# Patient Record
Sex: Female | Born: 1970
Health system: Southern US, Community
[De-identification: ages and names within clinical notes are randomized; demographics above are authoritative.]

## PROBLEM LIST (undated history)

## (undated) DIAGNOSIS — K319 Disease of stomach and duodenum, unspecified: Secondary | ICD-10-CM

## (undated) DIAGNOSIS — K8010 Calculus of gallbladder with chronic cholecystitis without obstruction: Secondary | ICD-10-CM

## (undated) DIAGNOSIS — F32A Depression, unspecified: Secondary | ICD-10-CM

## (undated) DIAGNOSIS — O24419 Gestational diabetes mellitus in pregnancy, unspecified control: Secondary | ICD-10-CM

## (undated) DIAGNOSIS — E669 Obesity, unspecified: Secondary | ICD-10-CM

## (undated) DIAGNOSIS — O1490 Unspecified pre-eclampsia, unspecified trimester: Secondary | ICD-10-CM

## (undated) DIAGNOSIS — R1013 Epigastric pain: Secondary | ICD-10-CM

## (undated) DIAGNOSIS — F329 Major depressive disorder, single episode, unspecified: Secondary | ICD-10-CM

## (undated) DIAGNOSIS — E785 Hyperlipidemia, unspecified: Secondary | ICD-10-CM

## (undated) HISTORY — DX: Gestational diabetes mellitus in pregnancy, unspecified control: O24.419

## (undated) HISTORY — DX: Major depressive disorder, single episode, unspecified: F32.9

## (undated) HISTORY — PX: TUBAL LIGATION: SHX77

## (undated) HISTORY — DX: Unspecified pre-eclampsia, unspecified trimester: O14.90

## (undated) HISTORY — DX: Hyperlipidemia, unspecified: E78.5

## (undated) HISTORY — DX: Obesity, unspecified: E66.9

## (undated) HISTORY — PX: LAPAROSCOPIC CHOLECYSTECTOMY: SUR755

## (undated) HISTORY — PX: WISDOM TOOTH EXTRACTION: SHX21

## (undated) HISTORY — DX: Epigastric pain: R10.13

## (undated) HISTORY — DX: Disease of stomach and duodenum, unspecified: K31.9

## (undated) HISTORY — PX: CATARACT EXTRACTION: SUR2

## (undated) HISTORY — DX: Depression, unspecified: F32.A

## (undated) HISTORY — DX: Calculus of gallbladder with chronic cholecystitis without obstruction: K80.10

---

## 2000-03-14 ENCOUNTER — Other Ambulatory Visit: Admission: RE | Admit: 2000-03-14 | Discharge: 2000-03-14 | Payer: Self-pay | Admitting: Obstetrics and Gynecology

## 2001-03-15 ENCOUNTER — Other Ambulatory Visit: Admission: RE | Admit: 2001-03-15 | Discharge: 2001-03-15 | Payer: Self-pay | Admitting: *Deleted

## 2002-03-27 ENCOUNTER — Other Ambulatory Visit: Admission: RE | Admit: 2002-03-27 | Discharge: 2002-03-27 | Payer: Self-pay | Admitting: Gynecology

## 2002-04-19 DIAGNOSIS — O1490 Unspecified pre-eclampsia, unspecified trimester: Secondary | ICD-10-CM

## 2002-04-19 HISTORY — DX: Unspecified pre-eclampsia, unspecified trimester: O14.90

## 2002-08-22 ENCOUNTER — Observation Stay (HOSPITAL_COMMUNITY): Admission: AD | Admit: 2002-08-22 | Discharge: 2002-08-22 | Payer: Self-pay | Admitting: Gynecology

## 2002-08-22 ENCOUNTER — Encounter: Payer: Self-pay | Admitting: Gynecology

## 2002-08-31 ENCOUNTER — Inpatient Hospital Stay (HOSPITAL_COMMUNITY): Admission: AD | Admit: 2002-08-31 | Discharge: 2002-09-03 | Payer: Self-pay | Admitting: Gynecology

## 2002-09-01 ENCOUNTER — Encounter (INDEPENDENT_AMBULATORY_CARE_PROVIDER_SITE_OTHER): Payer: Self-pay | Admitting: Specialist

## 2002-10-15 ENCOUNTER — Other Ambulatory Visit: Admission: RE | Admit: 2002-10-15 | Discharge: 2002-10-15 | Payer: Self-pay | Admitting: Gynecology

## 2003-10-29 ENCOUNTER — Ambulatory Visit (HOSPITAL_COMMUNITY): Admission: RE | Admit: 2003-10-29 | Discharge: 2003-10-29 | Payer: Self-pay | Admitting: Internal Medicine

## 2004-12-07 ENCOUNTER — Other Ambulatory Visit: Admission: RE | Admit: 2004-12-07 | Discharge: 2004-12-07 | Payer: Self-pay | Admitting: Gynecology

## 2005-11-25 ENCOUNTER — Encounter: Admission: RE | Admit: 2005-11-25 | Discharge: 2005-11-25 | Payer: Self-pay | Admitting: Gynecology

## 2005-12-30 ENCOUNTER — Other Ambulatory Visit: Admission: RE | Admit: 2005-12-30 | Discharge: 2005-12-30 | Payer: Self-pay | Admitting: Gynecology

## 2006-03-07 DIAGNOSIS — Z789 Other specified health status: Secondary | ICD-10-CM | POA: Insufficient documentation

## 2008-04-19 DIAGNOSIS — O24419 Gestational diabetes mellitus in pregnancy, unspecified control: Secondary | ICD-10-CM

## 2008-04-19 HISTORY — DX: Gestational diabetes mellitus in pregnancy, unspecified control: O24.419

## 2008-09-03 ENCOUNTER — Encounter: Admission: RE | Admit: 2008-09-03 | Discharge: 2008-09-03 | Payer: Self-pay | Admitting: Obstetrics & Gynecology

## 2008-10-22 ENCOUNTER — Inpatient Hospital Stay (HOSPITAL_COMMUNITY): Admission: AD | Admit: 2008-10-22 | Discharge: 2008-10-22 | Payer: Self-pay | Admitting: Obstetrics and Gynecology

## 2008-11-13 ENCOUNTER — Inpatient Hospital Stay (HOSPITAL_COMMUNITY): Admission: AD | Admit: 2008-11-13 | Discharge: 2008-11-15 | Payer: Self-pay | Admitting: Obstetrics and Gynecology

## 2009-01-14 ENCOUNTER — Ambulatory Visit (HOSPITAL_COMMUNITY): Admission: RE | Admit: 2009-01-14 | Discharge: 2009-01-14 | Payer: Self-pay | Admitting: Obstetrics & Gynecology

## 2010-01-08 ENCOUNTER — Encounter: Admission: RE | Admit: 2010-01-08 | Discharge: 2010-01-08 | Payer: Self-pay | Admitting: Internal Medicine

## 2010-07-24 LAB — CBC
HCT: 34.8 % — ABNORMAL LOW (ref 36.0–46.0)
MCHC: 33.5 g/dL (ref 30.0–36.0)
Platelets: 239 10*3/uL (ref 150–400)
RDW: 14.5 % (ref 11.5–15.5)

## 2010-07-26 LAB — CBC
HCT: 29.2 % — ABNORMAL LOW (ref 36.0–46.0)
MCHC: 34 g/dL (ref 30.0–36.0)
Platelets: 142 10*3/uL — ABNORMAL LOW (ref 150–400)
RBC: 4.31 MIL/uL (ref 3.87–5.11)
WBC: 16.2 10*3/uL — ABNORMAL HIGH (ref 4.0–10.5)
WBC: 9.9 10*3/uL (ref 4.0–10.5)

## 2010-07-26 LAB — URINALYSIS, ROUTINE W REFLEX MICROSCOPIC
Hgb urine dipstick: NEGATIVE
Nitrite: NEGATIVE
Specific Gravity, Urine: 1.01 (ref 1.005–1.030)
Urobilinogen, UA: 0.2 mg/dL (ref 0.0–1.0)

## 2010-07-26 LAB — FETAL FIBRONECTIN: Fetal Fibronectin: NEGATIVE

## 2010-07-26 LAB — RPR: RPR Ser Ql: NONREACTIVE

## 2010-07-26 LAB — GLUCOSE, CAPILLARY
Glucose-Capillary: 126 mg/dL — ABNORMAL HIGH (ref 70–99)
Glucose-Capillary: 79 mg/dL (ref 70–99)
Glucose-Capillary: 82 mg/dL (ref 70–99)

## 2010-09-04 NOTE — H&P (Signed)
NAME:  Laurie Bond, Laurie Bond                        ACCOUNT NO.:  [id number]   MEDICAL RECORD NO.:  [id number]                   PATIENT TYPE:  INP   LOCATION:  9152                                 FACILITY:  [facility]   PHYSICIAN:  Timothy P. Fontaine, M.D.           DATE OF BIRTH:  [birth year]   DATE OF ADMISSION:  08/22/2002  DATE OF DISCHARGE:                                HISTORY & PHYSICAL   CHIEF COMPLAINT:  1. Questionable rupture of membranes.  2. Preterm contractions.   HISTORY OF PRESENT ILLNESS:  A 40 year old G1, P0 female at [redacted] weeks  gestation who noted on the evening of admission that she had awoke in the  middle of the night and was wet on her perineum.  She got up and, upon  walking, had a gush of fluid that leaked onto the floor; she does not think  that it was urine, and she called and was instructed to present to the  hospital.  On initial presentation, she was found to have a reactive fetal  tracing with contractions every 2 minutes.  A perineal inspection, per  nursing, noted a dry perineum with negative nitrazine, and a perineal touch  slide showed some questionable ferning, although no definite changes.  A  sterile vaginal exam was done, and cervix was noted to be closed in the mid  to anterior position.  Patient was admitted with a beta strep performed and  pending.  Ultrasound was performed, which showed a cephalic presentation,  3.5-cm cervical length, translabial, estimated fetal weight at approximately  2200 to 2400 grams, and an AFI of 24.  Patient received 0.25 mg of  terbutaline subcu and an IV hydration.  For the remainder of her history,  see her [place].   PHYSICAL EXAMINATION:  VITAL SIGNS:  She is afebrile.  Vital signs are  stable.  HEENT:  Normal.  LUNGS:  Clear.  CARDIAC:  Regular rate without rubs, murmurs, or gallops.  ABDOMINAL EXAM:  Uterus is soft, nontender, consistent with dates.  External  fetal tracing shows a reactive fetus without  regular contractions.  PELVIC:  Sterile speculum exam shows a normal vaginal discharge.  No gross  rupture of membranes.  Nitrazine is negative.  Fern slide, per physician, is  negative.   ASSESSMENT AND PLAN:  A 40 year old G1, P0 at 31 weeks.  History suggestive  of rupture of membranes, although undocumented on exam.  She has done no  further leaking, other than that episode prior to admission, and notes that  she has had no other leak per vagina.  Her contractions initially were every  2 minutes, although now have resolved, and her sterile speculum exam is  without pooling, ferning, or nitrazine changes.  Her amniotic fluid index is  24.  I discussed with the patient and her husband the situation and that  although her history is suggestive, certainly there is no evidence of  rupture  of membranes on exam or ultrasound.  Options for management were  reviewed, to include continued expectant management for further episodes of  loss of fluid; if remains without episodes, then to discharge later today  with strict call precautions if this recurs at all.  More aggressive  approach to include amniocentesis with dye study, monitoring for vaginal  leakage, was discussed, although given, again, the stability of the  situation, we have decided on the expectant management route.  I did discuss  with them the issue of steroids, that if indeed we do document rupture of  membranes, that I would want to proceed with steroid administration but do  not feel it is indicated at this time, given there is no evidence of rupture  of membranes on exam.  Working diagnosis at  this time is urine leakage, but, again, we will monitor her for the  remainder of the day, and if she remains without leakage, then we will send  her home later today with strict return precautions.  Patient is comfortable  with this plan, and both her and her husband agree with this.                                                Timothy P. Audie Box, M.D.    TPF/MEDQ  D:  08/22/2002  T:  08/22/2002  Job:  [id number]

## 2010-09-04 NOTE — Discharge Summary (Signed)
   NAME:  Laurie Bond, Laurie Bond                        ACCOUNT NO.:  [id number]   MEDICAL RECORD NO.:  [id number]                   PATIENT TYPE:  INP   LOCATION:  9303                                 [facility]ACILITY:  [facility]   PHYSICIAN:  Ivor Costa. Farrel Gobble, M.D.              DATE OF BIRTH:  May 07, 1970   DATE OF ADMISSION:  08/31/2002  DATE OF DISCHARGE:  09/03/2002                                 DISCHARGE SUMMARY   DISCHARGE DIAGNOSES:  1. A 41 week intrauterine pregnancy with pregnancy induced hypertension.  2. Admitted for delivery.   PROCEDURE:  Forceps assisted delivery with a second degree episiotomy with  no laceration.   HISTORY OF PRESENT ILLNESS:  A 40 year old gravida 1 with an LMP of  January 12, 2002 with an [facility] of October 20, 2002 estimated gestational age is  32-6/7 weeks who had presented to the office with a headache, swelling,  increased blood pressures.  Labs were normal with normal uric acid, normal  H&H and platelets but was found to have a 5 pound weight gain in a 24 hour  period.  She had gained approximately 26 pounds in the last month and had  marked edema.  She had a reactive nonstress test and when the [facility] labs were  repeated she did have an increase in her uric acid.  It was up to 8.5.  LDH  151.  Was admitted for severe pre-eclampsia based on edema,  hemoconcentration and blood pressures.  Was placed on magnesium sulfate and  Pitocin, augmented with Pitocin.  She had also had betamethasone prior to  delivery.  She did progress and had a forceps assisted delivery with a baby  with Apgar's of 3 and 7 with a birth weight os 5 pounds 3 ounces, a female.   LABORATORIES:  A positive blood type, antibody negative.  Serology  hepatitis, HIV were all negative.  Rubella titer positive and Group B strep  was negative.   Her prenatal course has been complicated by some depression and she had been  on Effexor 150 mg daily.   DISCHARGE LABORATORIES:  White count was 18, hemoglobin  8.8, hematocrit  25.6, platelets 232.  LDH was 266.  Uric acid was 9.2.  AST was 28, ALT was  less than 19.   DISPOSITION:  She was discharged home.  Instructed to followup in the office  in two weeks and six weeks.  Motrin as needed for pain.  Prenatal vitamins.  [facility] discharge booklet.      Davonna Belling. Young, N.P.                      Ivor Costa. Farrel Gobble, M.D.    [place]  D:  10/09/2002  T:  10/09/2002  Job:  [id number]

## 2011-01-18 ENCOUNTER — Other Ambulatory Visit: Payer: Self-pay | Admitting: Obstetrics & Gynecology

## 2011-01-18 DIAGNOSIS — Z1231 Encounter for screening mammogram for malignant neoplasm of breast: Secondary | ICD-10-CM

## 2011-02-19 ENCOUNTER — Ambulatory Visit
Admission: RE | Admit: 2011-02-19 | Discharge: 2011-02-19 | Disposition: A | Discharge status: Home / Self Care | Payer: 59 | Source: Ambulatory Visit | Attending: Obstetrics & Gynecology | Admitting: Obstetrics & Gynecology

## 2011-02-19 DIAGNOSIS — Z1231 Encounter for screening mammogram for malignant neoplasm of breast: Secondary | ICD-10-CM

## 2011-05-16 ENCOUNTER — Ambulatory Visit (INDEPENDENT_AMBULATORY_CARE_PROVIDER_SITE_OTHER): Payer: 59

## 2011-05-16 DIAGNOSIS — R3129 Other microscopic hematuria: Secondary | ICD-10-CM

## 2011-05-16 DIAGNOSIS — R071 Chest pain on breathing: Secondary | ICD-10-CM

## 2011-05-16 DIAGNOSIS — R109 Unspecified abdominal pain: Secondary | ICD-10-CM

## 2011-08-19 ENCOUNTER — Ambulatory Visit: Payer: 59 | Admitting: Physician Assistant

## 2011-08-30 ENCOUNTER — Other Ambulatory Visit: Payer: Self-pay | Admitting: Physician Assistant

## 2011-09-02 ENCOUNTER — Ambulatory Visit: Payer: 59 | Admitting: Physician Assistant

## 2011-09-09 ENCOUNTER — Encounter: Payer: Self-pay | Admitting: Physician Assistant

## 2011-09-09 ENCOUNTER — Ambulatory Visit (INDEPENDENT_AMBULATORY_CARE_PROVIDER_SITE_OTHER): Payer: 59 | Admitting: Physician Assistant

## 2011-09-09 VITALS — BP 131/84 | HR 88 | Temp 98.8°F | Resp 18 | Ht 67.5 in | Wt 200.0 lb

## 2011-09-09 DIAGNOSIS — F329 Major depressive disorder, single episode, unspecified: Secondary | ICD-10-CM

## 2011-09-09 DIAGNOSIS — E785 Hyperlipidemia, unspecified: Secondary | ICD-10-CM

## 2011-09-09 DIAGNOSIS — F32A Depression, unspecified: Secondary | ICD-10-CM

## 2011-09-09 DIAGNOSIS — E782 Mixed hyperlipidemia: Secondary | ICD-10-CM

## 2011-09-09 LAB — COMPREHENSIVE METABOLIC PANEL
ALT: 12 U/L (ref 0–35)
AST: 13 U/L (ref 0–37)
Alkaline Phosphatase: 63 U/L (ref 39–117)
Creat: 0.71 mg/dL (ref 0.50–1.10)
Total Bilirubin: 0.4 mg/dL (ref 0.3–1.2)

## 2011-09-09 LAB — LIPID PANEL
Total CHOL/HDL Ratio: 3.1 Ratio
VLDL: 39 mg/dL (ref 0–40)

## 2011-09-09 MED ORDER — TRAZODONE HCL 50 MG PO TABS: 50.0000 mg | ORAL_TABLET | Freq: Every day | ORAL | Status: DC

## 2011-09-09 MED ORDER — FLUOXETINE HCL 40 MG PO CAPS: 40.0000 mg | ORAL_CAPSULE | Freq: Every day | ORAL | Status: DC

## 2011-09-09 MED ORDER — PRAVASTATIN SODIUM 40 MG PO TABS: 40.0000 mg | ORAL_TABLET | Freq: Every day | ORAL | Status: DC

## 2011-09-09 NOTE — Patient Instructions (Signed)
Consider making 2 of your three days for exercise during the week be weekend days.  Then, you only have to figure out your crazy schedule once a week!

## 2011-09-09 NOTE — Progress Notes (Signed)
  Subjective:    Patient ID: Laurie Bond, female    DOB: 21-Jul-1970, 41 y.o.   MRN: [id number]  HPI Presents for follow-up of depression and hyperlipidemia.  Current regimen is working well.  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  She does home visits, and her heart strings get pulled a lot. Has made healthy changes in her eating habits, but struggles to add exercise in to her week.   Review of Systems No chest pain, SOB, HA, dizziness, vision change, N/V, diarrhea, dysuria, myalgias, arthralgias or rash.     Objective:   Physical Exam  Vital signs noted. Well-developed, well nourished WF who is awake, alert and oriented, in NAD. HEENT: [place]/AT, sclera and conjunctiva are clear.   Neck: supple, non-tender, no lymphadenopathy, thyromegaly. Heart: RRR, no murmur Lungs: CTA Extremities: no cyanosis, clubbing or edema. Skin: warm and dry without rash.     Assessment & Plan:   1. Mixed hyperlipidemia  Comprehensive metabolic panel, Lipid panel, pravastatin (PRAVACHOL) 40 MG tablet; continue healthy eating, work on adding in exercise.  2. Depression  FLUoxetine (PROZAC) 40 MG capsule, traZODone (DESYREL) 50 MG tablet

## 2011-09-10 ENCOUNTER — Encounter: Payer: Self-pay | Admitting: Physician Assistant

## 2011-09-13 ENCOUNTER — Encounter: Payer: Self-pay | Admitting: Physician Assistant

## 2012-02-03 ENCOUNTER — Other Ambulatory Visit: Payer: Self-pay | Admitting: Obstetrics & Gynecology

## 2012-02-03 DIAGNOSIS — Z1231 Encounter for screening mammogram for malignant neoplasm of breast: Secondary | ICD-10-CM

## 2012-03-02 ENCOUNTER — Ambulatory Visit
Admission: RE | Admit: 2012-03-02 | Discharge: 2012-03-02 | Disposition: A | Discharge status: Home / Self Care | Payer: 59 | Source: Ambulatory Visit | Attending: Obstetrics & Gynecology | Admitting: Obstetrics & Gynecology

## 2012-03-02 DIAGNOSIS — Z1231 Encounter for screening mammogram for malignant neoplasm of breast: Secondary | ICD-10-CM

## 2012-03-07 ENCOUNTER — Encounter: Payer: Self-pay | Admitting: Physician Assistant

## 2012-03-07 ENCOUNTER — Ambulatory Visit (INDEPENDENT_AMBULATORY_CARE_PROVIDER_SITE_OTHER): Payer: 59 | Admitting: Physician Assistant

## 2012-03-07 VITALS — BP 130/82 | HR 76 | Temp 99.1°F | Resp 16 | Ht 67.0 in | Wt 183.6 lb

## 2012-03-07 DIAGNOSIS — E785 Hyperlipidemia, unspecified: Secondary | ICD-10-CM

## 2012-03-07 DIAGNOSIS — F329 Major depressive disorder, single episode, unspecified: Secondary | ICD-10-CM

## 2012-03-07 DIAGNOSIS — E782 Mixed hyperlipidemia: Secondary | ICD-10-CM

## 2012-03-07 DIAGNOSIS — F32A Depression, unspecified: Secondary | ICD-10-CM

## 2012-03-07 DIAGNOSIS — Z789 Other specified health status: Secondary | ICD-10-CM

## 2012-03-07 LAB — LIPID PANEL
Cholesterol: 167 mg/dL (ref 0–200)
Triglycerides: 167 mg/dL — ABNORMAL HIGH (ref <150)

## 2012-03-07 MED ORDER — TRAZODONE HCL 50 MG PO TABS: 50.0000 mg | ORAL_TABLET | Freq: Every day | ORAL | Status: DC

## 2012-03-07 MED ORDER — PRAVASTATIN SODIUM 40 MG PO TABS: 40.0000 mg | ORAL_TABLET | Freq: Every day | ORAL | Status: DC

## 2012-03-07 MED ORDER — FLUOXETINE HCL 40 MG PO CAPS: 40.0000 mg | ORAL_CAPSULE | Freq: Every day | ORAL | Status: DC

## 2012-03-07 NOTE — Progress Notes (Signed)
Subjective:    Patient ID: Laurie Bond, female    DOB: 02/11/1971, 41 y.o.   MRN: [id number]  HPI  This 41 y.o. female presents for evaluation of hyperlipidemia and depression.  Feels good.  Tolerating medications without difficulty.  Mood is good.  No down days. No SI/HI. She had a HA yesterday on the RIGHT side of her head.  Today, her RIGHT ear hurts.  No congestion, drainage, cough, sore throat.  Review of Systems No chest pain, SOB, HA, dizziness, vision change, N/V, diarrhea, constipation, dysuria, urinary urgency or frequency, myalgias, arthralgias or rash.  Past Medical History  Diagnosis Date  . Depression   . Hyperlipidemia   . Pre-eclampsia 2004  . Obesity   . Pre-eclampsia 2004    1st of 2 pregnancies  . Gestational diabetes 2010    2nd of 2 pregnancies    Past Surgical History  Procedure Date  . Tubal ligation   . Wisdom tooth extraction     Prior to Admission medications   Medication Sig Start Date End Date Taking? Authorizing Provider  FLUoxetine (PROZAC) 40 MG capsule Take 1 capsule (40 mg total) by mouth daily. 09/09/11  Yes Fernande Bras, PA-C  GILDESS 1/20 1-20 MG-MCG tablet  08/16/11  Yes Historical Provider, MD  pravastatin (PRAVACHOL) 40 MG tablet Take 1 tablet (40 mg total) by mouth daily. 09/09/11  Yes Rashied Corallo S Mardy Hoppe, PA-C  traZODone (DESYREL) 50 MG tablet Take 1 tablet (50 mg total) by mouth at bedtime. 09/09/11  Yes Eliam Snapp S Taysean Wager, PA-C    No Known Allergies  History   Social History  . Marital Status: Married    Spouse Name: Onalee Hua    Number of Children: 2  . Years of Education: 14   Occupational History  . RN     [employer]   Social History Main Topics  . Smoking status: Never Smoker   . Smokeless tobacco: Never Used  . Alcohol Use: 0.0 - 0.5 oz/week    0-1 drink(s) per week  . Drug Use: No  . Sexually Active: Yes -- Female partner(s)    Birth Control/ Protection: Pill   Other Topics Concern  . Not on  file   Social History Narrative   Lives with husband and 2 children.    Family History  Problem Relation Age of Onset  . Cancer Mother     skin cancer  . Hypertension Mother   . Deep vein thrombosis Father   . Cancer Father     prostate  . Hypertension Father   . Alcohol abuse Sister       Objective:   Physical Exam Blood pressure 130/82, pulse 76, temperature 99.1 F (37.3 C), temperature source Oral, resp. rate 16, height 5\' 7"  (1.702 m), weight 183 lb 9.6 oz (83.28 kg), last menstrual period 02/18/2012, SpO2 98.00%. Body mass index is 28.76 kg/(m^2). Well-developed, well nourished WF who is awake, alert and oriented, in NAD. HEENT: [place]/AT, PERRL, EOMI.  Sclera and conjunctiva are clear.  EAC are patent, TMs are normal in appearance. Nasal mucosa is pink and moist. OP is clear. Neck: supple, non-tender, no lymphadenopathy, thyromegaly. Heart: RRR, no murmur Lungs: normal effort, CTA Skin: warm and dry. Psychologic: good mood and appropriate affect, normal speech and behavior.     Assessment & Plan:   1. Hyperlipidemia  Lipid panel  2. Depression  traZODone (DESYREL) 50 MG tablet, FLUoxetine (PROZAC) 40 MG capsule  3. Mixed hyperlipidemia  pravastatin (  PRAVACHOL) 40 MG tablet

## 2012-03-07 NOTE — Assessment & Plan Note (Signed)
Has made great changes in her lifestyle.  Walks for 45 minutes 4x/week.  Eating smaller portions, healthier foods.  Has lost 17 pounds since her last visit (09/09/2011).  Continue these efforts and statin.

## 2012-03-07 NOTE — Assessment & Plan Note (Signed)
Well-controlled.  Continue current medications.  Re-evaluate in 6 months.

## 2012-03-08 ENCOUNTER — Encounter: Payer: Self-pay | Admitting: Physician Assistant

## 2012-04-30 ENCOUNTER — Other Ambulatory Visit: Payer: Self-pay | Admitting: Physician Assistant

## 2012-10-05 ENCOUNTER — Ambulatory Visit: Payer: 59 | Admitting: Physician Assistant

## 2012-10-05 ENCOUNTER — Other Ambulatory Visit: Payer: Self-pay | Admitting: Physician Assistant

## 2012-10-17 ENCOUNTER — Encounter: Payer: Self-pay | Admitting: Physician Assistant

## 2012-10-17 ENCOUNTER — Ambulatory Visit (INDEPENDENT_AMBULATORY_CARE_PROVIDER_SITE_OTHER): Payer: 59 | Admitting: Emergency Medicine

## 2012-10-17 ENCOUNTER — Ambulatory Visit: Payer: 59

## 2012-10-17 VITALS — BP 132/80 | HR 86 | Temp 98.7°F | Resp 16 | Ht 67.25 in | Wt 196.2 lb

## 2012-10-17 DIAGNOSIS — F32A Depression, unspecified: Secondary | ICD-10-CM

## 2012-10-17 DIAGNOSIS — M25569 Pain in unspecified knee: Secondary | ICD-10-CM

## 2012-10-17 DIAGNOSIS — M25562 Pain in left knee: Secondary | ICD-10-CM

## 2012-10-17 DIAGNOSIS — E785 Hyperlipidemia, unspecified: Secondary | ICD-10-CM

## 2012-10-17 DIAGNOSIS — F329 Major depressive disorder, single episode, unspecified: Secondary | ICD-10-CM

## 2012-10-17 MED ORDER — TRAZODONE HCL 50 MG PO TABS: 50.0000 mg | ORAL_TABLET | Freq: Every day | ORAL | Status: DC

## 2012-10-17 MED ORDER — MELOXICAM 15 MG PO TABS: 15.0000 mg | ORAL_TABLET | Freq: Every day | ORAL | Status: DC

## 2012-10-17 MED ORDER — PRAVASTATIN SODIUM 40 MG PO TABS: 40.0000 mg | ORAL_TABLET | Freq: Every day | ORAL | Status: DC

## 2012-10-17 MED ORDER — FLUOXETINE HCL 40 MG PO CAPS: 40.0000 mg | ORAL_CAPSULE | Freq: Every day | ORAL | Status: DC

## 2012-10-17 NOTE — Patient Instructions (Signed)
I will contact you with your lab results as soon as they are available.   If you have not heard from me in 2 weeks, please contact me.  The fastest way to get your results is to register for My Chart (see the instructions on the last page of this printout).   

## 2012-10-17 NOTE — Progress Notes (Signed)
  Subjective:    Patient ID: Laurie Bond, female    DOB: 1970-08-11, 42 y.o.   MRN: [id number]  HPI This 42 y.o. female presents for evaluation of hyperlipidemia and depression. She's doing well on current treatment, without adverse effects.  Feels like the fluoxetine and trazodone are doing what she needs from them.  LEFT knee pain x 6 months. Primarily sore in the back of the knee, with a sharp pain in the front sometimes.  No increased warmth, no redness.  Pain behind the knee occurs with activity, resolves with rest.  Not particularly severe, "just nagging."  The sharp pain in front occurs at rest, no aggravating/alleviating factors, lasts 1-2 days.  Hasn't tried taking anything for the pain.  No recalled injury/trauma.  Past medical history, surgical history, family history, social history and problem list reviewed.   Review of Systems No chest pain, SOB, HA, dizziness, vision change, N/V, diarrhea, constipation, dysuria, urinary urgency or frequency, or rash.      Objective:   Physical Exam  Blood pressure 132/80, pulse 86, temperature 98.7 F (37.1 C), temperature source Oral, resp. rate 16, height 5' 7.25" (1.708 m), weight 196 lb 3.2 oz (88.996 kg), SpO2 100.00%. Body mass index is 30.51 kg/(m^2). Well-developed, well nourished WF who is awake, alert and oriented, in NAD. HEENT: [place]/AT, sclera and conjunctiva are clear.   Neck: supple, non-tender, no lymphadenopathy, thyromegaly. Heart: RRR, no murmur Lungs: normal effort, CTA Extremities: no cyanosis, clubbing or edema. LEFT knee is normal on inspection.  FROM, with mild crepitus.  Non-tender on palpation.  Stable to stress.  No effusion. Skin: warm and dry without rash. Psychologic: good mood and appropriate affect, normal speech and behavior.  LEFT Knee: [facility] reading (PRIMARY) by  Dr. Cleta Alberts.  Normal knee.       Assessment & Plan:  Hyperlipidemia - Plan: Comprehensive metabolic panel, Lipid panel, pravastatin  (PRAVACHOL) 40 MG tablet  Depression - Plan: FLUoxetine (PROZAC) 40 MG capsule, traZODone (DESYREL) 50 MG tablet  Knee pain, left - Plan: DG Knee Complete 4 Views Left, meloxicam (MOBIC) 15 MG tablet   RTC 6 months for fasting labs, sooner, PRN.  If knee pain persists, plan referral to orthopedics.  Fernande Bras, PA-C Physician Assistant-Certified [facility]

## 2012-10-18 LAB — COMPREHENSIVE METABOLIC PANEL
ALT: 14 U/L (ref 0–35)
Albumin: 4.4 g/dL (ref 3.5–5.2)
BUN: 10 mg/dL (ref 6–23)
CO2: 23 mEq/L (ref 19–32)
Calcium: 9.4 mg/dL (ref 8.4–10.5)
Chloride: 102 mEq/L (ref 96–112)
Creat: 0.71 mg/dL (ref 0.50–1.10)
Potassium: 4.5 mEq/L (ref 3.5–5.3)

## 2012-10-18 LAB — LIPID PANEL
Cholesterol: 211 mg/dL — ABNORMAL HIGH (ref 0–200)
HDL: 56 mg/dL (ref >39)
Total CHOL/HDL Ratio: 3.8 Ratio

## 2013-02-22 ENCOUNTER — Other Ambulatory Visit: Payer: Self-pay

## 2013-04-24 ENCOUNTER — Other Ambulatory Visit: Payer: Self-pay

## 2013-04-24 DIAGNOSIS — Z1231 Encounter for screening mammogram for malignant neoplasm of breast: Secondary | ICD-10-CM

## 2013-04-25 ENCOUNTER — Ambulatory Visit
Admission: RE | Admit: 2013-04-25 | Discharge: 2013-04-25 | Disposition: A | Discharge status: Home / Self Care | Payer: 59 | Source: Ambulatory Visit

## 2013-04-25 DIAGNOSIS — Z1231 Encounter for screening mammogram for malignant neoplasm of breast: Secondary | ICD-10-CM

## 2013-05-01 ENCOUNTER — Encounter: Payer: Self-pay | Admitting: Physician Assistant

## 2013-05-01 ENCOUNTER — Ambulatory Visit (INDEPENDENT_AMBULATORY_CARE_PROVIDER_SITE_OTHER): Payer: 59 | Admitting: Physician Assistant

## 2013-05-01 VITALS — BP 129/84 | HR 77 | Temp 98.5°F | Resp 16 | Ht 67.5 in | Wt 194.0 lb

## 2013-05-01 DIAGNOSIS — F3289 Other specified depressive episodes: Secondary | ICD-10-CM

## 2013-05-01 DIAGNOSIS — F329 Major depressive disorder, single episode, unspecified: Secondary | ICD-10-CM

## 2013-05-01 DIAGNOSIS — E785 Hyperlipidemia, unspecified: Secondary | ICD-10-CM

## 2013-05-01 DIAGNOSIS — F32A Depression, unspecified: Secondary | ICD-10-CM

## 2013-05-01 LAB — COMPLETE METABOLIC PANEL WITH GFR
ALT: 12 U/L (ref 0–35)
AST: 16 U/L (ref 0–37)
Albumin: 4.3 g/dL (ref 3.5–5.2)
Alkaline Phosphatase: 57 U/L (ref 39–117)
BILIRUBIN TOTAL: 0.3 mg/dL (ref 0.3–1.2)
BUN: 14 mg/dL (ref 6–23)
CALCIUM: 9 mg/dL (ref 8.4–10.5)
CHLORIDE: 103 meq/L (ref 96–112)
CO2: 25 mEq/L (ref 19–32)
CREATININE: 0.79 mg/dL (ref 0.50–1.10)
GFR, Est African American: >89 mL/min
GFR, Est Non African American: >89 mL/min
Glucose, Bld: 89 mg/dL (ref 70–99)
Potassium: 4.4 mEq/L (ref 3.5–5.3)
SODIUM: 134 meq/L — AB (ref 135–145)
Total Protein: 6.9 g/dL (ref 6.0–8.3)

## 2013-05-01 LAB — LIPID PANEL
Cholesterol: 158 mg/dL (ref 0–200)
HDL: 48 mg/dL (ref >39)
LDL CALC: 74 mg/dL (ref 0–99)
TRIGLYCERIDES: 178 mg/dL — AB (ref <150)
Total CHOL/HDL Ratio: 3.3 Ratio
VLDL: 36 mg/dL (ref 0–40)

## 2013-05-01 MED ORDER — FLUOXETINE HCL 40 MG PO CAPS: 40.0000 mg | ORAL_CAPSULE | Freq: Every day | ORAL | Status: DC

## 2013-05-01 MED ORDER — PRAVASTATIN SODIUM 40 MG PO TABS: 40.0000 mg | ORAL_TABLET | Freq: Every day | ORAL | Status: DC

## 2013-05-01 MED ORDER — TRAZODONE HCL 50 MG PO TABS: 50.0000 mg | ORAL_TABLET | Freq: Every day | ORAL | Status: DC

## 2013-05-01 NOTE — Patient Instructions (Signed)
Keep up the great work. I think you have an excellent plan, and it's one you can stick to!

## 2013-05-01 NOTE — Progress Notes (Signed)
   Subjective:    Patient ID: Laurie Bond, female    DOB: July 04, 1970, 43 y.o.   MRN: [id number]  PCP: No primary provider on file.  Chief Complaint  Patient presents with  . Follow-up    6 month  . Medication Refill   Medications, allergies, past medical history, surgical history, family history, social history and problem list reviewed and updated.  Patient Active Problem List   Diagnosis Date Noted  . Hyperlipidemia 03/07/2012  . Depression 03/07/2012  . Immune to varicella 03/07/2006   HPI Overall is feeling well.  Tolerating medications without difficulty.  No adverse effects.  Ready to commit to lifestyle changes for weight loss and cardiovascular risk reduction.  Has increased whole grains, focusing on a "Mediterranean diet."  Exercise-15 minutes x 2/day. Goal of losing 45-50 lbs (1 lb/week) over the year.  Review of Systems No chest pain, SOB, HA, dizziness, vision change, N/V, diarrhea, constipation, dysuria, urinary urgency or frequency, myalgias, arthralgias or rash.     Objective:   Physical Exam  Constitutional: She is oriented to person, place, and time. Vital signs are normal. She appears well-developed and well-nourished. She is active and cooperative. No distress.  HENT:  Head: Normocephalic and atraumatic.  Right Ear: Hearing normal.  Left Ear: Hearing normal.  Eyes: EOM are normal. Pupils are equal, round, and reactive to light.  Neck: Normal range of motion. Neck supple. No thyromegaly present.  Cardiovascular: Normal rate, regular rhythm and normal heart sounds.   Pulses:      Radial pulses are 2+ on the right side, and 2+ on the left side.  Pulmonary/Chest: Effort normal and breath sounds normal.  Lymphadenopathy:       Head (right side): No tonsillar, no preauricular, no posterior auricular and no occipital adenopathy present.       Head (left side): No tonsillar, no preauricular, no posterior auricular and no occipital adenopathy present.    She  has no cervical adenopathy.       Right: No supraclavicular adenopathy present.       Left: No supraclavicular adenopathy present.  Neurological: She is alert and oriented to person, place, and time. No sensory deficit.  Skin: Skin is warm, dry and intact. No rash noted. No cyanosis or erythema. Nails show no clubbing.  Psychiatric: She has a normal mood and affect.          Assessment & Plan:  1. Hyperlipidemia Congratulated on her commitment to healthy eating and exercise.  She has a good plan.  Continue pravastatin. - COMPLETE METABOLIC PANEL WITH GFR - Lipid panel - pravastatin (PRAVACHOL) 40 MG tablet; Take 1 tablet (40 mg total) by mouth daily.  Dispense: 90 tablet; Refill: 3  2. Depression Stable.  Continue current treatment. - traZODone (DESYREL) 50 MG tablet; Take 1 tablet (50 mg total) by mouth at bedtime.  Dispense: 90 tablet; Refill: 3 - FLUoxetine (PROZAC) 40 MG capsule; Take 1 capsule (40 mg total) by mouth daily.  Dispense: 90 capsule; Refill: 3  Return in about 6 months (around 10/29/2013) for re-evaluation of lipids and mood.  Fara Chute, PA-C Physician Assistant-Certified [facility]

## 2013-08-15 ENCOUNTER — Ambulatory Visit (INDEPENDENT_AMBULATORY_CARE_PROVIDER_SITE_OTHER): Payer: 59 | Admitting: Family Medicine

## 2013-08-15 VITALS — BP 118/78 | HR 91 | Temp 98.7°F | Resp 16 | Ht 67.5 in | Wt 183.4 lb

## 2013-08-15 DIAGNOSIS — H109 Unspecified conjunctivitis: Secondary | ICD-10-CM

## 2013-08-15 DIAGNOSIS — J029 Acute pharyngitis, unspecified: Secondary | ICD-10-CM

## 2013-08-15 LAB — POCT RAPID STREP A (OFFICE): Rapid Strep A Screen: NEGATIVE

## 2013-08-15 MED ORDER — MOXIFLOXACIN HCL (2X DAY) 0.5 % OP SOLN
1.0000 [drp] | Freq: Two times a day (BID) | OPHTHALMIC | Status: DC
Start: 2013-08-15 — End: 2013-11-09

## 2013-08-15 NOTE — Patient Instructions (Signed)
Pleases use the moxeza drops twice a day for one week.  No contacts until you are finished with treatment- also start a new pair on contacts, and use a new case Let me know if you do not feel better soon- Sooner if worse.   I will be in touch with your throat culture

## 2013-08-15 NOTE — Progress Notes (Signed)
[facility] [address] [place] [address] [phone]  Date:  08/15/2013   Name:  Laurie Bond   DOB:  1970/10/14   MRN:  [id number]  PCP:  JEFFERY,[name], PA-C    Chief Complaint: right eye is red and guppy, Cough, Sore Throat and Sinusitis   History of Present Illness:  Laurie Bond is a 43 y.o. very pleasant female patient who presents with the following:  Here today with ilness.  She has noted a cough, sold, sinus congestion and ST for about one week.  Yesterday her right eye started turning red and was crusted shut this am.  Left eye is ok so far.   She does wear contacts some of the time but is in glasses today.  Her vision is ok, eye is not painful but is itchy.   Her son did test positive for strep last week.   She has not noted a fever- no chills or aches, but she does feel tired.   She has not noted any GI symptoms but does not have much appetite.   She is generally healthy, has a BTl.  She is on continuous OCP  Patient Active Problem List   Diagnosis Date Noted  . Hyperlipidemia 03/07/2012  . Depression 03/07/2012  . Immune to varicella 03/07/2006    Past Medical History  Diagnosis Date  . Depression   . Hyperlipidemia   . Pre-eclampsia 2004  . Obesity   . Pre-eclampsia 2004    1st of 2 pregnancies  . Gestational diabetes 2010    2nd of 2 pregnancies    Past Surgical History  Procedure Laterality Date  . Tubal ligation    . Wisdom tooth extraction      History  Substance Use Topics  . Smoking status: Never Smoker   . Smokeless tobacco: Never Used  . Alcohol Use: 0.0 - 0.5 oz/week    0-1 drink(s) per week    Family History  Problem Relation Age of Onset  . Cancer Mother     skin cancer  . Hypertension Mother   . Deep vein thrombosis Father   . Cancer Father     prostate  . Hypertension Father   . Alcohol abuse Sister     No Known Allergies  Medication list has been reviewed and updated.  Current  Outpatient Prescriptions on File Prior to Visit  Medication Sig Dispense Refill  . FLUoxetine (PROZAC) 40 MG capsule Take 1 capsule (40 mg total) by mouth daily.  90 capsule  3  . GILDESS 1/20 1-20 MG-MCG tablet       . pravastatin (PRAVACHOL) 40 MG tablet Take 1 tablet (40 mg total) by mouth daily.  90 tablet  3  . traZODone (DESYREL) 50 MG tablet Take 1 tablet (50 mg total) by mouth at bedtime.  90 tablet  3   No current facility-administered medications on file prior to visit.    Review of Systems:  As per HPI- otherwise negative.   Physical Examination: Filed Vitals:   08/15/13 0829  BP: 118/78  Pulse: 91  Temp: 98.7 F (37.1 C)  Resp: 16   Filed Vitals:   08/15/13 0829  Height: 5' 7.5" (1.715 m)  Weight: 183 lb 6.4 oz (83.19 kg)   Body mass index is 28.28 kg/(m^2). Ideal Body Weight: Weight in (lb) to have BMI = 25: 161.7  GEN: WDWN, NAD, Non-toxic, A & O x 3, overweight, looks well HEENT: Atraumatic, Normocephalic.  Neck supple. No masses, No LAD.  Bilateral TM wnl, oropharynx normal.  PEERL,EOMI.   Right eye is injected and shows some dried matter on lashes.fundoscopic exam wnl Ears and Nose: No external deformity. CV: RRR, No M/G/R. No JVD. No thrill. No extra heart sounds. PULM: CTA B, no wheezes, crackles, rhonchi. No retractions. No resp. distress. No accessory muscle use. ABD: S, NT, ND, +BS. No rebound. No HSM. EXTR: No c/c/e NEURO Normal gait.  PSYCH: Normally interactive. Conversant. Not depressed or anxious appearing.  Calm demeanor.   Results for orders placed in visit on 08/15/13  POCT RAPID STREP A (OFFICE)      Result Value Ref Range   Rapid Strep A Screen Negative  Negative    Assessment and Plan: Sore throat - Plan: POCT rapid strep A, Culture, Group A Strep  Acute pharyngitis  Conjunctivitis - Plan: Moxifloxacin HCl 0.5 % SOLN  Likely viral URI with pinkeye.  Treat with moxeza, await throat culture.   See patient instructions for more  details.     Signed Lamar Blinks, MD

## 2013-08-17 LAB — CULTURE, GROUP A STREP: ORGANISM ID, BACTERIA: NORMAL

## 2013-11-09 ENCOUNTER — Encounter: Payer: Self-pay | Admitting: Family Medicine

## 2013-11-09 ENCOUNTER — Ambulatory Visit (INDEPENDENT_AMBULATORY_CARE_PROVIDER_SITE_OTHER): Payer: 59 | Admitting: Family Medicine

## 2013-11-09 VITALS — BP 120/68 | HR 66 | Temp 98.0°F | Resp 16 | Ht 67.0 in | Wt 174.0 lb

## 2013-11-09 DIAGNOSIS — F32A Depression, unspecified: Secondary | ICD-10-CM

## 2013-11-09 DIAGNOSIS — E785 Hyperlipidemia, unspecified: Secondary | ICD-10-CM

## 2013-11-09 DIAGNOSIS — F3289 Other specified depressive episodes: Secondary | ICD-10-CM

## 2013-11-09 DIAGNOSIS — F329 Major depressive disorder, single episode, unspecified: Secondary | ICD-10-CM

## 2013-11-09 LAB — LIPID PANEL
Cholesterol: 169 mg/dL (ref 0–200)
HDL: 49 mg/dL (ref >39)
LDL Cholesterol: 80 mg/dL (ref 0–99)
Total CHOL/HDL Ratio: 3.4 Ratio
Triglycerides: 199 mg/dL — ABNORMAL HIGH (ref <150)
VLDL: 40 mg/dL (ref 0–40)

## 2013-11-09 LAB — COMPLETE METABOLIC PANEL WITHOUT GFR
ALT: 10 U/L (ref 0–35)
AST: 13 U/L (ref 0–37)
Albumin: 4.3 g/dL (ref 3.5–5.2)
BUN: 12 mg/dL (ref 6–23)
CO2: 24 meq/L (ref 19–32)
Calcium: 9.4 mg/dL (ref 8.4–10.5)
GFR, Est African American: >89 mL/min
Potassium: 4.6 meq/L (ref 3.5–5.3)

## 2013-11-09 LAB — COMPLETE METABOLIC PANEL WITH GFR
Alkaline Phosphatase: 54 U/L (ref 39–117)
Chloride: 103 mEq/L (ref 96–112)
Creat: 0.81 mg/dL (ref 0.50–1.10)
GFR, Est Non African American: 89 mL/min
Glucose, Bld: 76 mg/dL (ref 70–99)
Sodium: 136 mEq/L (ref 135–145)
Total Bilirubin: 0.4 mg/dL (ref 0.2–1.2)
Total Protein: 7.3 g/dL (ref 6.0–8.3)

## 2013-11-09 LAB — TSH: TSH: 0.817 u[IU]/mL (ref 0.350–4.500)

## 2013-11-09 MED ORDER — FLUOXETINE HCL 40 MG PO CAPS: 40.0000 mg | ORAL_CAPSULE | Freq: Every day | ORAL | Status: DC

## 2013-11-09 MED ORDER — PRAVASTATIN SODIUM 40 MG PO TABS: 40.0000 mg | ORAL_TABLET | Freq: Every day | ORAL | Status: DC

## 2013-11-09 MED ORDER — TRAZODONE HCL 50 MG PO TABS: 50.0000 mg | ORAL_TABLET | Freq: Every day | ORAL | Status: DC

## 2013-11-09 NOTE — Progress Notes (Signed)
Chief Complaint:  Chief Complaint  Patient presents with  . Follow-up    Hyperlipidemia  . Mood  . Medication Refill    Fluoxetine 40 mg, Pravastatin 40 mg, Trazadone 50 mg    HPI: Laurie Bond is a 43 y.o. female who is here for   6  Month Medication refills She is doing well She has no SEs, doing well on her meds She is compliant with meds She was planning to go on a mediterranean diet the last time she spoke with [name]  She has lost some significant weight since then-20 lbs.    Wt Readings from Last 3 Encounters:  11/09/13 174 lb (78.926 kg)  08/15/13 183 lb 6.4 oz (83.19 kg)  05/01/13 194 lb (87.998 kg)    Past Medical History  Diagnosis Date  . Depression   . Hyperlipidemia   . Pre-eclampsia 2004  . Obesity   . Pre-eclampsia 2004    1st of 2 pregnancies  . Gestational diabetes 2010    2nd of 2 pregnancies   Past Surgical History  Procedure Laterality Date  . Tubal ligation    . Wisdom tooth extraction     History   Social History  . Marital Status: Married    Spouse Name: Shanon Brow    Number of Children: 2  . Years of Education: 14   Occupational History  . RN     [employer]   Social History Main Topics  . Smoking status: Never Smoker   . Smokeless tobacco: Never Used  . Alcohol Use: 0.0 - 0.5 oz/week    0-1 drink(s) per week  . Drug Use: No  . Sexual Activity: Yes    Partners: Male    Birth Control/ Protection: Pill   Other Topics Concern  . None   Social History Narrative   Lives with husband and 2 children.   Family History  Problem Relation Age of Onset  . Cancer Mother     skin cancer  . Hypertension Mother   . Deep vein thrombosis Father   . Cancer Father     prostate  . Hypertension Father   . Alcohol abuse Sister    No Known Allergies Prior to Admission medications   Medication Sig Start Date End Date Taking? Authorizing Provider  FLUoxetine (PROZAC) 40 MG capsule Take 1 capsule (40 mg  total) by mouth daily. 05/01/13  Yes Fara Chute, PA-C  GILDESS 1/20 1-20 MG-MCG tablet  08/16/11  Yes Historical Provider, MD  pravastatin (PRAVACHOL) 40 MG tablet Take 1 tablet (40 mg total) by mouth daily. 05/01/13  Yes [name] S Jeffery, PA-C  traZODone (DESYREL) 50 MG tablet Take 1 tablet (50 mg total) by mouth at bedtime. 05/01/13  Yes [name] S Jeffery, PA-C  Moxifloxacin HCl 0.5 % SOLN Apply 1 drop to eye 2 (two) times daily. Use for one week. 08/15/13   Gay Filler Copland, MD     ROS: The patient denies fevers, chills, night sweats, unintentional weight loss, chest pain, palpitations, wheezing, dyspnea on exertion, nausea, vomiting, abdominal pain, dysuria, hematuria, melena, numbness, weakness, or tingling.   All other systems have been reviewed and were otherwise negative with the exception of those mentioned in the HPI and as above.    PHYSICAL EXAM: Filed Vitals:   11/09/13 0909  BP: 120/68  Pulse: 66  Temp: 98 F (36.7 C)  Resp: 16   Filed Vitals:   11/09/13 0909  Height:  5\' 7"  (1.702 m)  Weight: 174 lb (78.926 kg)   Body mass index is 27.25 kg/(m^2).  General: Alert, no acute distress HEENT:  Normocephalic, atraumatic, oropharynx patent. EOMI, PERRLA. No thyroidmegaly Cardiovascular:  Regular rate and rhythm, no rubs murmurs or gallops.Radial pulse intact. No pedal edema.  Respiratory: Clear to auscultation bilaterally.  No wheezes, rales, or rhonchi.  No cyanosis, no use of accessory musculature GI: No organomegaly, abdomen is soft and non-tender, positive bowel sounds.  No masses. Skin: No rashes. Neurologic: Facial musculature symmetric. Psychiatric: Patient is appropriate throughout our interaction. Lymphatic: No cervical lymphadenopathy Musculoskeletal: Gait intact.   LABS: Results for orders placed in visit on 11/09/13  COMPLETE METABOLIC PANEL WITH GFR      Result Value Ref Range   Sodium 136  135 - 145 mEq/L   Potassium 4.6  3.5 - 5.3 mEq/L   Chloride  103  96 - 112 mEq/L   CO2 24  19 - 32 mEq/L   Glucose, Bld 76  70 - 99 mg/dL   BUN 12  6 - 23 mg/dL   Creat 0.81  0.50 - 1.10 mg/dL   Total Bilirubin 0.4  0.2 - 1.2 mg/dL   Alkaline Phosphatase 54  39 - 117 U/L   AST 13  0 - 37 U/L   ALT 10  0 - 35 U/L   Total Protein 7.3  6.0 - 8.3 g/dL   Albumin 4.3  3.5 - 5.2 g/dL   Calcium 9.4  8.4 - 10.5 mg/dL   GFR, Est African American >89     GFR, Est Non African American 89    LIPID PANEL      Result Value Ref Range   Cholesterol 169  0 - 200 mg/dL   Triglycerides 199 (*) <150 mg/dL   HDL 49  >39 mg/dL   Total CHOL/HDL Ratio 3.4     VLDL 40  0 - 40 mg/dL   LDL Cholesterol 80  0 - 99 mg/dL  TSH      Result Value Ref Range   TSH 0.817  0.350 - 4.500 uIU/mL     EKG/XRAY:   Primary read interpreted by Dr. Marin Comment at [facility].   ASSESSMENT/PLAN: Encounter Diagnoses  Name Primary?  . Depression Yes  . Hyperlipidemia    Refilled meds Labs pending: CMP, TSH, Lipids Will call with results F/u in 6 months  Gross sideeffects, risk and benefits, and alternatives of medications d/w patient. Patient is aware that all medications have potential sideeffects and we are unable to predict every sideeffect or drug-drug interaction that may occur.  Makella Buckingham, [place], DO 11/11/2013 12:51 PM  LM that electrolytes, kidneys, liver fxn normal. Lipids were stable and unchanges  For the msot part, TG was slightly high. Continue with meds and good work with weight loss.

## 2013-12-18 ENCOUNTER — Encounter (INDEPENDENT_AMBULATORY_CARE_PROVIDER_SITE_OTHER): Payer: 59 | Admitting: Family Medicine

## 2013-12-20 ENCOUNTER — Encounter: Payer: Self-pay | Admitting: Family Medicine

## 2013-12-20 ENCOUNTER — Ambulatory Visit (INDEPENDENT_AMBULATORY_CARE_PROVIDER_SITE_OTHER): Payer: 59 | Admitting: Family Medicine

## 2013-12-20 ENCOUNTER — Ambulatory Visit (INDEPENDENT_AMBULATORY_CARE_PROVIDER_SITE_OTHER): Payer: 59

## 2013-12-20 ENCOUNTER — Other Ambulatory Visit: Payer: Self-pay | Admitting: Family Medicine

## 2013-12-20 VITALS — BP 120/82 | HR 84 | Temp 98.8°F | Resp 16 | Ht 67.0 in | Wt 175.6 lb

## 2013-12-20 DIAGNOSIS — R109 Unspecified abdominal pain: Secondary | ICD-10-CM

## 2013-12-20 DIAGNOSIS — M546 Pain in thoracic spine: Secondary | ICD-10-CM

## 2013-12-20 DIAGNOSIS — R002 Palpitations: Secondary | ICD-10-CM

## 2013-12-20 DIAGNOSIS — R110 Nausea: Secondary | ICD-10-CM

## 2013-12-20 LAB — HEPATIC FUNCTION PANEL
ALBUMIN: 4.5 g/dL (ref 3.5–5.2)
ALK PHOS: 63 U/L (ref 39–117)
ALT: 8 U/L (ref 0–35)
AST: 12 U/L (ref 0–37)
BILIRUBIN INDIRECT: 0.4 mg/dL (ref 0.2–1.2)
Bilirubin, Direct: 0.1 mg/dL (ref 0.0–0.3)
Total Bilirubin: 0.5 mg/dL (ref 0.2–1.2)
Total Protein: 7.3 g/dL (ref 6.0–8.3)

## 2013-12-20 LAB — THYROID PANEL WITH TSH
FREE THYROXINE INDEX: 2.5 (ref 1.4–3.8)
T3 UPTAKE: 23 % (ref 22.0–35.0)
T4, Total: 10.8 ug/dL (ref 4.5–12.0)
TSH: 0.641 u[IU]/mL (ref 0.350–4.500)

## 2013-12-20 MED ORDER — OMEPRAZOLE 20 MG PO CPDR: 20.0000 mg | DELAYED_RELEASE_CAPSULE | Freq: Every day | ORAL | Status: DC

## 2013-12-20 NOTE — Progress Notes (Signed)
Subjective:    Patient ID: Laurie Bond, female    DOB: [birth year], 43 y.o.   MRN: [id number]  HPI   This 43 y.o. cauc female is here for evaluation of palpitations and nausea, onset ~ 2 weeks ago w/ rapid heart rate and nausea ("I just didn't feel good"). She had SOB and weakness; her husband called EMS who came to home and evaluated pt; ECG >> normal and glucose = 93. Pt felt better the next day; she thought she may be having some anxiety related to stress w/ work, etc. She is compliant with Fluoxetine which she has been on for years. Hx of panic attacks when she was in her 43s.  Monday night (12/17/13)- Pt had recurrence of rapid heart reate w/ nausea accompanied by squeezing discomfort between scapula. Ibuprofen relieved pain. She denies vomiting or diarrhea. Stools have been lighter than usual and less bulky.   Pt takes OCP prescribed by GYN. Hx of menstrual migraine/ myalgias/arthralgias and other hormonal symptoms which have been relieved by OCP.  Patient Active Problem List   Diagnosis Date Noted  . Hyperlipidemia 03/07/2012  . Depression 03/07/2012  . Immune to varicella 03/07/2006   Prior to Admission medications   Medication Sig Start Date End Date Taking? Authorizing Provider  FLUoxetine (PROZAC) 40 MG capsule Take 1 capsule (40 mg total) by mouth daily. 11/09/13  Yes Thao P Le, DO  GILDESS 1/20 1-20 MG-MCG tablet  08/16/11  Yes Historical Provider, MD  pravastatin (PRAVACHOL) 40 MG tablet Take 1 tablet (40 mg total) by mouth daily. 11/09/13  Yes Thao P Le, DO  traZODone (DESYREL) 50 MG tablet Take 1 tablet (50 mg total) by mouth at bedtime. 11/09/13  Yes Thao P Le, DO    History   Social History  . Marital Status: Married    Spouse Name: Shanon Brow    Number of Children: 2  . Years of Education: 14   Occupational History  . RN     [employer]   Social History Main Topics  . Smoking status: Never Smoker   . Smokeless tobacco: Never Used  . Alcohol  Use: 0.0 - 0.5 oz/week    0-1 drink(s) per week  . Drug Use: No  . Sexual Activity: Yes    Partners: Male    Birth Control/ Protection: Pill   Other Topics Concern  . Not on file   Social History Narrative   Lives with husband and 2 children.    Family History  Problem Relation Age of Onset  . Cancer Mother     skin cancer  . Hypertension Mother   . Deep vein thrombosis Father   . Cancer Father     prostate  . Hypertension Father   . Alcohol abuse Sister     Review of Systems  Constitutional: Negative.   HENT: Negative for trouble swallowing.   Eyes: Negative.   Respiratory: Positive for shortness of breath. Negative for cough, choking, chest tightness and wheezing.   Cardiovascular: Positive for palpitations. Negative for chest pain.  Gastrointestinal: Positive for nausea.  Endocrine: Negative.   Musculoskeletal: Positive for back pain.  Skin: Negative.   Neurological: Positive for light-headedness.      Objective:   Physical Exam  Nursing note and vitals reviewed. Constitutional: She is oriented to person, place, and time. Vital signs are normal. She appears well-developed and well-nourished. No distress.  HENT:  Head: Normocephalic and atraumatic.  Right Ear: Hearing, tympanic membrane, external  ear and ear canal normal.  Left Ear: Hearing, tympanic membrane, external ear and ear canal normal.  Nose: Nose normal.  Mouth/Throat: Uvula is midline, oropharynx is clear and moist and mucous membranes are normal. No oral lesions. Normal dentition. No dental caries.  Eyes: Conjunctivae, EOM and lids are normal. No scleral icterus.  Neck: Trachea normal, normal range of motion and phonation normal. Neck supple. No spinous process tenderness and no muscular tenderness present. No mass and no thyromegaly present.  Cardiovascular: Normal rate, regular rhythm, S1 normal, S2 normal and normal heart sounds.   No extrasystoles are present. PMI is not displaced.  Exam reveals no  gallop and no friction rub.   No murmur heard. Pulmonary/Chest: Effort normal and breath sounds normal. No respiratory distress. She has no decreased breath sounds. She has no wheezes.  Abdominal: Soft. Normal appearance and bowel sounds are normal. She exhibits no distension, no abdominal bruit, no pulsatile midline mass and no mass. There is no hepatosplenomegaly. There is tenderness in the left lower quadrant. There is no rigidity, no guarding, no CVA tenderness and negative Murphy's sign.  Musculoskeletal: Normal range of motion. She exhibits no edema and no tenderness.  Neurological: She is alert and oriented to person, place, and time. No cranial nerve deficit. She exhibits normal muscle tone. Coordination normal.  Skin: Skin is warm and dry. No rash noted. She is not diaphoretic. No erythema. No pallor.  Psychiatric: She has a normal mood and affect. Her behavior is normal. Judgment and thought content normal.    ECG: NSR; no ST-TW changes. Mild LAE. No ectopy.   [facility] reading (PRIMARY) by  Dr. Leward Quan: Normal heart size and clear lung fields; no acute pulmonary process. Degenerative changes in thoracic spine.     Assessment & Plan:  Palpitations - Plan: Thyroid Panel With TSH, DG Chest 2 View, EKG 12-Lead, Vitamin D, 25-hydroxy  Nausea alone - Monitor diet triggers; trail PPI. Plan: Hepatic Function Panel, H. pylori antibody, IgG, US Abdomen Limited RUQ  Abdominal pain, unspecified site - Plan: US Abdomen Limited RUQ  Midline thoracic back pain - Early DDD in T-spine. Plan: Vitamin D, 25-hydroxy   Meds ordered this encounter  Medications  . omeprazole (PRILOSEC) 20 MG capsule    Sig: Take 1 capsule (20 mg total) by mouth daily.    Dispense:  30 capsule    Refill:  3

## 2013-12-20 NOTE — Patient Instructions (Signed)
Vitamin D Deficiency Vitamin D is an important vitamin that your body needs. Having too little of it in your body is called a deficiency. A very bad deficiency can make your bones soft and can cause a condition called rickets.  Vitamin D is important to your body for different reasons, such as:   It helps your body absorb 2 minerals called calcium and phosphorus.  It helps make your bones healthy.  It may prevent some diseases, such as diabetes and multiple sclerosis.  It helps your muscles and heart. You can get vitamin D in several ways. It is a natural part of some foods. The vitamin is also added to some dairy products and cereals. Some people take vitamin D supplements. Also, your body makes vitamin D when you are in the sun. It changes the sun's rays into a form of the vitamin that your body can use. CAUSES   Not eating enough foods that contain vitamin D.  Not getting enough sunlight.  Having certain digestive system diseases that make it hard to absorb vitamin D. These diseases include Crohn's disease, chronic pancreatitis, and cystic fibrosis.  Having a surgery in which part of the stomach or small intestine is removed.  Being obese. Fat cells pull vitamin D out of your blood. That means that obese people may not have enough vitamin D left in their blood and in other body tissues.  Having chronic kidney or liver disease. RISK FACTORS Risk factors are things that make you more likely to develop a vitamin D deficiency. They include:  Being older.  Not being able to get outside very much.  Living in a nursing home.  Having had broken bones.  Having weak or thin bones (osteoporosis).  Having a disease or condition that changes how your body absorbs vitamin D.  Having dark skin.  Some medicines such as seizure medicines or steroids.  Being overweight or obese. SYMPTOMS Mild cases of vitamin D deficiency may not have any symptoms. If you have a very bad case, symptoms  may include:  Bone pain.  Muscle pain.  Falling often.  Broken bones caused by a minor injury, due to osteoporosis. DIAGNOSIS A blood test is the best way to tell if you have a vitamin D deficiency. TREATMENT Vitamin D deficiency can be treated in different ways. Treatment for vitamin D deficiency depends on what is causing it. Options include:  Taking vitamin D supplements.  Taking a calcium supplement. Your caregiver will suggest what dose is best for you. HOME CARE INSTRUCTIONS  Take any supplements that your caregiver prescribes. Follow the directions carefully. Take only the suggested amount.  Have your blood tested 2 months after you start taking supplements.  Eat foods that contain vitamin D. Healthy choices include:  Fortified dairy products, cereals, or juices. Fortified means vitamin D has been added to the food. Check the label on the package to be sure.  Fatty fish like salmon or trout.  Eggs.  Oysters.  Do not use a tanning bed.  Keep your weight at a healthy level. Lose weight if you need to.  Keep all follow-up appointments. Your caregiver will need to perform blood tests to make sure your vitamin D deficiency is going away. SEEK MEDICAL CARE IF:  You have any questions about your treatment.  You continue to have symptoms of vitamin D deficiency.  You have nausea or vomiting.  You are constipated.  You feel confused.  You have severe abdominal or back pain. MAKE   SURE YOU:  Understand these instructions.  Will watch your condition.  Will get help right away if you are not doing well or get worse. Document Released: 06/28/2011 Document Revised: 07/31/2012 Document Reviewed: 06/28/2011 [facility] Patient Information 2015 [place], [place]. This information is not intended to replace advice given to you by your health care provider. Make sure you discuss any questions you have with your health care provider.   Helicobacter Pylori Antibodies  Test This is a blood test which looks for a germ called Helicobacter pylori. This can also be diagnosed by a breath test or a microscopic examination of a portion (biopsy) of the small bowel. H. pylori is a germ that is found in the cells that line the stomach. It is a risk factor for stomach and small bowel ulcers, long-standing inflammation of the lining of the stomach, or even ulcers that may occur in the esophagus (the canal that runs from the mouth to the stomach). This bacterium is also a factor in stomach cancer. The amount of the bacteria is found in about 10% of healthy persons younger than 44 years of age and the amount of the bacteria increases with age. Most persons with these bacteria have no symptoms; however, it is thought that when these bacteria cause ulcers, antibiotic medications can be used to help eliminate or reduce the problem.  PREPARATION FOR TEST No preparation or fasting is necessary. NORMAL FINDINGS Negative (H. pylori bacteria not present). Ranges for normal findings may vary among different laboratories and hospitals. You should always check with your doctor after having lab work or other tests done to discuss the meaning of your test results and whether or not your values are considered within normal limits. MEANING OF TEST  Your caregiver will go over the test results with you and discuss the importance and meaning of your results, as well as treatment options and the need for additional tests if necessary. OBTAINING THE TEST RESULTS It is your responsibility to obtain your test results. Ask the lab or department performing the test when and how you will get your results. Document Released: 04/29/2004 Document Revised: 08/20/2013 Document Reviewed: 03/16/2008 [facility] Patient Information 2015 [place], [place]. This information is not intended to replace advice given to you by your health care provider. Make sure you discuss any questions you have with your health care  provider.

## 2013-12-21 LAB — VITAMIN D 25 HYDROXY (VIT D DEFICIENCY, FRACTURES): Vit D, 25-Hydroxy: 40 ng/mL (ref 30–89)

## 2013-12-21 LAB — H. PYLORI ANTIBODY, IGG: H Pylori IgG: 0.57 {ISR}

## 2013-12-22 LAB — LIPASE: LIPASE: 26 U/L (ref 0–75)

## 2013-12-28 ENCOUNTER — Ambulatory Visit
Admission: RE | Admit: 2013-12-28 | Discharge: 2013-12-28 | Disposition: A | Discharge status: Home / Self Care | Payer: 59 | Source: Ambulatory Visit | Attending: Family Medicine | Admitting: Family Medicine

## 2013-12-28 DIAGNOSIS — R109 Unspecified abdominal pain: Secondary | ICD-10-CM

## 2013-12-28 DIAGNOSIS — R110 Nausea: Secondary | ICD-10-CM

## 2013-12-28 NOTE — Progress Notes (Signed)
This encounter was created in error - please disregard.

## 2014-01-10 ENCOUNTER — Ambulatory Visit: Payer: 59 | Admitting: Family Medicine

## 2014-01-16 ENCOUNTER — Encounter: Payer: Self-pay | Admitting: Family Medicine

## 2014-01-16 ENCOUNTER — Ambulatory Visit (INDEPENDENT_AMBULATORY_CARE_PROVIDER_SITE_OTHER): Payer: 59 | Admitting: Family Medicine

## 2014-01-16 VITALS — BP 116/64 | HR 82 | Temp 98.0°F | Resp 16 | Ht 67.5 in | Wt 178.4 lb

## 2014-01-16 DIAGNOSIS — K3189 Other diseases of stomach and duodenum: Secondary | ICD-10-CM

## 2014-01-16 DIAGNOSIS — K319 Disease of stomach and duodenum, unspecified: Secondary | ICD-10-CM | POA: Insufficient documentation

## 2014-01-16 DIAGNOSIS — R1013 Epigastric pain: Principal | ICD-10-CM

## 2014-01-16 HISTORY — DX: Disease of stomach and duodenum, unspecified: K31.9

## 2014-01-16 NOTE — Progress Notes (Signed)
S:  This 43 y.o. Cauc female returns for follow-up; she is much improved with Prilosec and dietary restrictions. She has eliminated caffeine and wine (though she does have a rare glass of white wine). Certain spicy foods and some dairy exacerbate GI symptoms. Stools are normal. Pt takes Prilosec at bedtime since symptoms seem to more nocturnal.  Patient Active Problem List   Diagnosis Date Noted  . Dyspepsia and disorder of function of stomach 01/16/2014  . Hyperlipidemia 03/07/2012  . Depression 03/07/2012  . Immune to varicella 03/07/2006    Prior to Admission medications   Medication Sig Start Date End Date Taking? Authorizing Provider  FLUoxetine (PROZAC) 40 MG capsule Take 1 capsule (40 mg total) by mouth daily. 11/09/13  Yes Thao P Le, DO  GILDESS 1/20 1-20 MG-MCG tablet  08/16/11  Yes Historical Provider, MD  omeprazole (PRILOSEC) 20 MG capsule Take 1 capsule (20 mg total) by mouth daily. 12/20/13  Yes Barton Fanny, MD  pravastatin (PRAVACHOL) 40 MG tablet Take 1 tablet (40 mg total) by mouth daily. 11/09/13  Yes Thao P Le, DO  traZODone (DESYREL) 50 MG tablet Take 1 tablet (50 mg total) by mouth at bedtime. 11/09/13  Yes Thao P Le, DO    SOC and FAM Hx reviewed.   ROS: AS per HPI. Negative for fatigue, n/v/d, palpitations, chest discomfort, abd pain, back pain, melena or hematochezia.   O: Filed Vitals:   01/16/14 1020  BP: 116/64  Pulse: 82  Temp: 98 F (36.7 C)  Resp: 16    GEN: In NAD: WN,WD. HENT: [place]/AT; EOMI w/ clear conj/sclerae. Otherwise unremarkable. COR: RRR. Normal S1 and S2. LUNGS: Normal resp rate anad effort. BACK: No CVAT. ABD: Soft, no distention, normal BS. NT w/o HSM. NEURO: A&O x 3; CNs intact. Nonfocal.   Results for orders placed in visit on 12/20/13  THYROID PANEL WITH TSH      Result Value Ref Range   T4, Total 10.8  4.5 - 12.0 ug/dL   T3 Uptake 23  22.0 - 35.0 %   Free Thyroxine Index 2.5  1.4 - 3.8   TSH 0.641  0.350 - 4.500 uIU/mL   HEPATIC FUNCTION PANEL      Result Value Ref Range   Total Bilirubin 0.5  0.2 - 1.2 mg/dL   Bilirubin, Direct 0.1  0.0 - 0.3 mg/dL   Indirect Bilirubin 0.4  0.2 - 1.2 mg/dL   Alkaline Phosphatase 63  39 - 117 U/L   AST 12  0 - 37 U/L   ALT 8  0 - 35 U/L   Total Protein 7.3  6.0 - 8.3 g/dL   Albumin 4.5  3.5 - 5.2 g/dL  H. PYLORI ANTIBODY, IGG      Result Value Ref Range   H Pylori IgG 0.57    VITAMIN D 25 HYDROXY      Result Value Ref Range   Vit D, 25-Hydroxy 40  30 - 89 ng/mL    A/P: Dyspepsia and disorder of function of stomach- Symptoms controlled w/ Prilosec and diet modifications. Labs above reviewed w/ pt.  RTC prn; sch CPE /fasting labs w/ [name]- 6 months.

## 2014-01-16 NOTE — Patient Instructions (Addendum)
I recommend a complete physical exam in about 6 months. Laurie Bond will resume appointments next year; call in January 2016 and inquire if this is the case. You can then schedule your physical and fasting labs with her.       Mediterranean Diet  Why follow it? Research shows.   Those who follow the Mediterranean diet have a reduced risk of heart disease    The diet is associated with a reduced incidence of Parkinson's and Alzheimer's diseases   People following the diet may have longer life expectancies and lower rates of chronic diseases    The Dietary Guidelines for Americans recommends the Mediterranean diet as an eating plan to promote health and prevent disease  What Is the Mediterranean Diet?    Healthy eating plan based on typical foods and recipes of Mediterranean-style cooking   The diet is primarily a plant based diet; these foods should make up a majority of meals   Starches - Plant based foods should make up a majority of meals - They are an important sources of vitamins, minerals, energy, antioxidants, and fiber - Choose whole grains, foods high in fiber and minimally processed items  - Typical grain sources include wheat, oats, barley, corn, brown rice, bulgar, farro, millet, polenta, couscous  - Various types of beans include chickpeas, lentils, fava beans, black beans, white beans   Fruits  Veggies - Large quantities of antioxidant rich fruits & veggies; 6 or more servings  - Vegetables can be eaten raw or lightly drizzled with oil and cooked  - Vegetables common to the traditional Mediterranean Diet include: artichokes, arugula, beets, broccoli, brussel sprouts, cabbage, carrots, celery, collard greens, cucumbers, eggplant, kale, leeks, lemons, lettuce, mushrooms, okra, onions, peas, peppers, potatoes, pumpkin, radishes, rutabaga, shallots, spinach, sweet potatoes, turnips, zucchini - Fruits common to the Mediterranean Diet include: apples, apricots, avocados, cherries,  clementines, dates, figs, grapefruits, grapes, melons, nectarines, oranges, peaches, pears, pomegranates, strawberries, tangerines  Fats - Replace butter and margarine with healthy oils, such as olive oil, canola oil, and tahini  - Limit nuts to no more than a handful a day  - Nuts include walnuts, almonds, pecans, pistachios, pine nuts  - Limit or avoid candied, honey roasted or heavily salted nuts - Olives are central to the [employer] - can be eaten whole or used in a variety of dishes   Meats Protein - Limiting red meat: no more than a few times a month - When eating red meat: choose lean cuts and keep the portion to the size of deck of cards - Eggs: approx. 0 to 4 times a week  - Fish and lean poultry: at least 2 a week  - Healthy protein sources include, chicken, [place], lean beef, lamb - Increase intake of seafood such as tuna, salmon, trout, mackerel, shrimp, scallops - Avoid or limit high fat processed meats such as sausage and bacon  Dairy - Include moderate amounts of low fat dairy products  - Focus on healthy dairy such as fat free yogurt, skim milk, low or reduced fat cheese - Limit dairy products higher in fat such as whole or 2% milk, cheese, ice cream  Alcohol - Moderate amounts of red wine is ok  - No more than 5 oz daily for women (all ages) and men older than age 42  - No more than 10 oz of wine daily for men younger than 73  Other - Limit sweets and other desserts  - Use herbs and spices instead  of salt to flavor foods  - Herbs and spices common to the traditional Mediterranean Diet include: basil, bay leaves, chives, cloves, cumin, fennel, garlic, lavender, marjoram, mint, oregano, parsley, pepper, rosemary, sage, savory, sumac, tarragon, thyme   It's not just a diet, it's a lifestyle:    The Mediterranean diet includes lifestyle factors typical of those in the region    Foods, drinks and meals are best eaten with others and savored   Daily physical activity is  important for overall good health   This could be strenuous exercise like running and aerobics   This could also be more leisurely activities such as walking, housework, yard-work, or taking the stairs   Moderation is the key; a balanced and healthy diet accommodates most foods and drinks   Consider portion sizes and frequency of consumption of certain foods   Meal Ideas & Options:    Breakfast:  o Whole wheat toast or whole wheat English muffins with peanut butter & hard boiled egg o Steel cut oats topped with apples & cinnamon and skim milk  o Fresh fruit: banana, strawberries, melon, berries, peaches  o Smoothies: strawberries, bananas, greek yogurt, peanut butter o Low fat greek yogurt with blueberries and granola  o Egg white omelet with spinach and mushrooms o Breakfast couscous: whole wheat couscous, apricots, skim milk, cranberries    Sandwiches:  o Hummus and grilled vegetables (peppers, zucchini, squash) on whole wheat bread   o Grilled chicken on whole wheat pita with lettuce, tomatoes, cucumbers or tzatziki  o Tuna salad on whole wheat bread: tuna salad made with greek yogurt, olives, red peppers, capers, green onions o Garlic rosemary lamb pita: lamb sauted with garlic, rosemary, salt & pepper; add lettuce, cucumber, greek yogurt to pita - flavor with lemon juice and black pepper    Seafood:  o Mediterranean grilled salmon, seasoned with garlic, basil, parsley, lemon juice and black pepper o Shrimp, lemon, and spinach whole-grain pasta salad made with low fat greek yogurt  o Seared scallops with lemon orzo  o Seared tuna steaks seasoned salt, pepper, coriander topped with tomato mixture of olives, tomatoes, olive oil, minced garlic, parsley, green onions and cappers    Meats:  o Herbed greek chicken salad with kalamata olives, cucumber, feta  o Red bell peppers stuffed with spinach, bulgur, lean ground beef (or lentils) & topped with feta   o Kebabs: skewers of chicken,  tomatoes, onions, zucchini, squash  o [place] burgers: made with red onions, mint, dill, lemon juice, feta cheese topped with roasted red peppers   Vegetarian o Cucumber salad: cucumbers, artichoke hearts, celery, red onion, feta cheese, tossed in olive oil & lemon juice  o Hummus and whole grain pita points with a greek salad (lettuce, tomato, feta, olives, cucumbers, red onion) o Lentil soup with celery, carrots made with vegetable broth, garlic, salt and pepper  o Tabouli salad: parsley, bulgur, mint, scallions, cucumbers, tomato, radishes, lemon juice, olive oil, salt and pepper. o

## 2014-05-08 ENCOUNTER — Ambulatory Visit (INDEPENDENT_AMBULATORY_CARE_PROVIDER_SITE_OTHER): Payer: 59

## 2014-05-08 ENCOUNTER — Ambulatory Visit (INDEPENDENT_AMBULATORY_CARE_PROVIDER_SITE_OTHER): Payer: 59 | Admitting: Family Medicine

## 2014-05-08 ENCOUNTER — Encounter: Payer: Self-pay | Admitting: Family Medicine

## 2014-05-08 VITALS — BP 133/85 | HR 87 | Temp 98.9°F | Resp 16 | Ht 68.0 in | Wt 181.0 lb

## 2014-05-08 DIAGNOSIS — J189 Pneumonia, unspecified organism: Secondary | ICD-10-CM

## 2014-05-08 NOTE — Patient Instructions (Signed)

## 2014-05-08 NOTE — Progress Notes (Signed)
Subjective:    Patient ID: Laurie Bond, female    DOB: May 10, 1970, 44 y.o.   MRN: [id number]  HPI  This is a 44 year old female with PMH HLD and depression who is presenting for follow up of pneumonia. She reports about 20 days ago she was having URI symptoms. A few days later it started to settle into her chest. She states she started developing SOB and wheezing and so [facility] and was diagnosed with left lung pneumonia, confirmed by radiograph. She was prescribed zpak, tessalon perles and albuterol. She has finished the antibiotic at this time and has stopped the tessalon. She went to her GYN yesterday who listened to her lungs and said they "didn't sound good" and told her to follow up with her PCP. She continues to use albuterol every  four hours for wheezing/SOB. She states she is improved. 1 week ago she was SOB with 10 feet of walking. She now does not get SOB until 30 feet. She reports yesterday she was wheezing some but today not as much. The cough is no longer productive and she is not coughing as much. The cough is worse in the mornings and better throughout the day. She is sleeping ok at night. She was never febrile. She does not have a history of asthma. She has never wheezed before with illness. She is not a smoker. Daughter is sick with similar symptoms.  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    Review of Systems  Constitutional: Negative for fever, chills, diaphoresis and fatigue.  HENT: Positive for congestion. Negative for ear pain, postnasal drip, rhinorrhea, sinus pressure, sore throat, trouble swallowing and voice change.   Eyes: Negative for redness.  Respiratory: Positive for cough, shortness of breath and wheezing.   Gastrointestinal: Negative for nausea, vomiting, abdominal pain and diarrhea.  Skin: Negative for rash.  Allergic/Immunologic: Negative for environmental allergies.  Neurological: Negative for  dizziness and headaches.  Hematological: Negative for adenopathy.  Psychiatric/Behavioral: Negative for sleep disturbance.    Patient Active Problem List   Diagnosis Date Noted  . Dyspepsia and disorder of function of stomach 01/16/2014  . Hyperlipidemia 03/07/2012  . Depression 03/07/2012  . Immune to varicella 03/07/2006   Prior to Admission medications   Medication Sig Start Date End Date Taking? Authorizing Provider  FLUoxetine (PROZAC) 40 MG capsule Take 1 capsule (40 mg total) by mouth daily. 11/09/13  Yes Thao P Le, DO  GILDESS 1/20 1-20 MG-MCG tablet  08/16/11  Yes Historical Provider, MD  omeprazole (PRILOSEC) 20 MG capsule Take 1 capsule (20 mg total) by mouth daily. 12/20/13  Yes Barton Fanny, MD  pravastatin (PRAVACHOL) 40 MG tablet Take 1 tablet (40 mg total) by mouth daily. 11/09/13  Yes Thao P Le, DO  traZODone (DESYREL) 50 MG tablet Take 1 tablet (50 mg total) by mouth at bedtime. 11/09/13  Yes Thao P Le, DO   No Known Allergies  Patient's social and family history were reviewed.     Objective:   Physical Exam  Constitutional: She is oriented to person, place, and time. She appears well-developed and well-nourished. No distress.  HENT:  Head: Normocephalic and atraumatic.  Right Ear: Hearing, tympanic membrane, external ear and ear canal normal.  Left Ear: Hearing, tympanic membrane, external ear and ear canal normal.  Nose: Nose normal. Right sinus exhibits no maxillary sinus tenderness and no frontal sinus tenderness. Left sinus  exhibits no maxillary sinus tenderness and no frontal sinus tenderness.  Mouth/Throat: Uvula is midline, oropharynx is clear and moist and mucous membranes are normal. No oropharyngeal exudate.  Eyes: Conjunctivae, EOM and lids are normal. Pupils are equal, round, and reactive to light. Right eye exhibits no discharge. Left eye exhibits no discharge. No scleral icterus.  Cardiovascular: Normal rate, regular rhythm, normal heart sounds,  intact distal pulses and normal pulses.   No murmur heard. Pulmonary/Chest: Effort normal. No accessory muscle usage. No tachypnea. No respiratory distress. She has no decreased breath sounds. She has no wheezes. She has no rhonchi. She has rales (left lung base).  Moderate air movement.  Musculoskeletal: Normal range of motion.  Lymphadenopathy:       Head (right side): No submental, no submandibular and no tonsillar adenopathy present.       Head (left side): No submental, no submandibular and no tonsillar adenopathy present.    She has no cervical adenopathy.  Neurological: She is alert and oriented to person, place, and time.  Skin: Skin is warm, dry and intact. No lesion and no rash noted. She is not diaphoretic.  Psychiatric: She has a normal mood and affect. Her speech is normal and behavior is normal. Thought content normal.   BP 133/85 mmHg  Pulse 87  Temp(Src) 98.9 F (37.2 C)  Resp 16  Ht 5\' 8"  (1.727 m)  Wt 181 lb (82.101 kg)  BMI 27.53 kg/m2  SpO2 97%  [facility] reading (PRIMARY) by  Dr. Tamala Julian: minimal left lower lobe infiltrate, likely resolving (dx'd with left lower lung infiltrate at outside facility 1 week ago)     Assessment & Plan:  1. Pneumonia, organism unspecified Pt has been adequately treated with antibiotics/Zithromax and her symptoms are improving. She continues to have SOB and occasional wheezing although much improved from 1 week ago. She will continue albuterol every six hours as needed for symptoms. She will follow up if in 3-4 weeks if symptoms worsen or if not continuing to improve.  No need for repeat xray in 3 months. - DG Chest 2 View; Future    Lowen Barringer Elayne Guerin, M.D. [facility] [address] [place], [place]  [address] [phone] phone [phone] fax  Benjaman Pott. Drenda Freeze, MHS [facility]  05/09/2014

## 2014-07-23 ENCOUNTER — Encounter: Payer: 59 | Admitting: Physician Assistant

## 2014-10-01 ENCOUNTER — Ambulatory Visit (INDEPENDENT_AMBULATORY_CARE_PROVIDER_SITE_OTHER): Payer: 59 | Admitting: Physician Assistant

## 2014-10-01 ENCOUNTER — Encounter: Payer: Self-pay | Admitting: Physician Assistant

## 2014-10-01 VITALS — BP 125/80 | HR 71 | Temp 98.2°F | Resp 16 | Ht 67.5 in | Wt 187.6 lb

## 2014-10-01 DIAGNOSIS — Z0000 Encounter for general adult medical examination without abnormal findings: Secondary | ICD-10-CM

## 2014-10-01 DIAGNOSIS — E785 Hyperlipidemia, unspecified: Secondary | ICD-10-CM | POA: Diagnosis not present

## 2014-10-01 DIAGNOSIS — F329 Major depressive disorder, single episode, unspecified: Secondary | ICD-10-CM

## 2014-10-01 DIAGNOSIS — F32A Depression, unspecified: Secondary | ICD-10-CM

## 2014-10-01 DIAGNOSIS — Z114 Encounter for screening for human immunodeficiency virus [HIV]: Secondary | ICD-10-CM

## 2014-10-01 LAB — LIPID PANEL
CHOLESTEROL: 208 mg/dL — AB (ref 0–200)
HDL: 60 mg/dL (ref >=46)
LDL Cholesterol: 115 mg/dL — ABNORMAL HIGH (ref 0–99)
TRIGLYCERIDES: 165 mg/dL — AB (ref <150)
Total CHOL/HDL Ratio: 3.5 Ratio
VLDL: 33 mg/dL (ref 0–40)

## 2014-10-01 LAB — COMPREHENSIVE METABOLIC PANEL
ALBUMIN: 4.4 g/dL (ref 3.5–5.2)
ALK PHOS: 53 U/L (ref 39–117)
ALT: 10 U/L (ref 0–35)
AST: 13 U/L (ref 0–37)
BUN: 14 mg/dL (ref 6–23)
CHLORIDE: 102 meq/L (ref 96–112)
CO2: 21 mEq/L (ref 19–32)
Calcium: 9.4 mg/dL (ref 8.4–10.5)
Creat: 0.79 mg/dL (ref 0.50–1.10)
Glucose, Bld: 79 mg/dL (ref 70–99)
POTASSIUM: 4.3 meq/L (ref 3.5–5.3)
SODIUM: 134 meq/L — AB (ref 135–145)
TOTAL PROTEIN: 7.5 g/dL (ref 6.0–8.3)
Total Bilirubin: 0.5 mg/dL (ref 0.2–1.2)

## 2014-10-01 MED ORDER — TRAZODONE HCL 50 MG PO TABS: 50.0000 mg | ORAL_TABLET | Freq: Every day | ORAL | Status: DC

## 2014-10-01 MED ORDER — FLUOXETINE HCL 40 MG PO CAPS: 40.0000 mg | ORAL_CAPSULE | Freq: Every day | ORAL | Status: DC

## 2014-10-01 MED ORDER — PRAVASTATIN SODIUM 40 MG PO TABS: 40.0000 mg | ORAL_TABLET | Freq: Every day | ORAL | Status: DC

## 2014-10-01 NOTE — Patient Instructions (Signed)
I will contact you with your lab results as soon as they are available.   If you have not heard from me in 2 weeks, please contact me.  The fastest way to get your results is to register for My Chart (see the instructions on the last page of this printout).  Keeping You Healthy  Get These Tests 1. Blood Pressure- Have your blood pressure checked once a year by your health care provider.  Normal blood pressure is 120/80. 2. Weight- Have your body mass index (BMI) calculated to screen for obesity.  BMI is measure of body fat based on height and weight.  You can also calculate your own BMI at GravelBags.it. 3. Cholesterol- Have your cholesterol checked every 5 years starting at age 45 then yearly starting at age 45. 22. Chlamydia, HIV, and other sexually transmitted diseases- Get screened every year until age 45, then within three months of each new sexual provider. 5. Pap Test - Every 1-5 years; discuss with your health care provider. 6. Mammogram- Every 1-2 years starting at age 45--50  Take these medicines  Calcium with Vitamin D-Your body needs 1200 mg of Calcium each day and [phone] IU of Vitamin D daily.  Your body can only absorb 500 mg of Calcium at a time so Calcium must be taken in 2 or 3 divided doses throughout the day.  Multivitamin with folic acid- Once daily if it is possible for you to become pregnant.  Get these Immunizations  Gardasil-Series of three doses; prevents HPV related illness such as genital warts and cervical cancer.  Menactra-Single dose; prevents meningitis.  Tetanus shot- Every 10 years.  Flu shot-Every year.  Take these steps 1. Do not smoke-Your healthcare provider can help you quit.  For tips on how to quit go to www.smokefree.gov or call 1-800 QUITNOW. 2. Be physically active- Exercise 5 days a week for at least 30 minutes.  If you are not already physically active, start slow and gradually work up to 30 minutes of moderate physical  activity.  Examples of moderate activity include walking briskly, dancing, swimming, bicycling, etc. 3. Breast Cancer- A self breast exam every month is important for early detection of breast cancer.  For more information and instruction on self breast exams, ask your healthcare provider or https://www.patel.info/. 4. Eat a healthy diet- Eat a variety of healthy foods such as fruits, vegetables, whole grains, low fat milk, low fat cheeses, yogurt, lean meats, poultry and fish, beans, nuts, tofu, etc.  For more information go to www. Thenutritionsource.org 5. Drink alcohol in moderation- Limit alcohol intake to one drink or less per day. Never drink and drive. 6. Depression- Your emotional health is as important as your physical health.  If you're feeling down or losing interest in things you normally enjoy please talk to your healthcare provider about being screened for depression. 7. Dental visit- Brush and floss your teeth twice daily; visit your dentist twice a year. 8. Eye doctor- Get an eye exam at least every 2 years. 9. Helmet use- Always wear a helmet when riding a bicycle, motorcycle, rollerblading or skateboarding. 42. Safe sex- If you may be exposed to sexually transmitted infections, use a condom. 11. Seat belts- Seat belts can save your live; always wear one. 12. Smoke/Carbon Monoxide detectors- These detectors need to be installed on the appropriate level of your home. Replace batteries at least once a year. 13. Skin cancer- When out in the sun please cover up and use sunscreen 15 SPF  or higher. 14. Violence- If anyone is threatening or hurting you, please tell your healthcare provider.

## 2014-10-01 NOTE — Progress Notes (Signed)
Subjective:    Patient ID: Laurie Bond, female    DOB: 09-03-1970, 44 y.o.   MRN: [id number]  PCP: Sou Nohr, PA-C  Chief Complaint  Patient presents with  . Annual Exam    HPI  Presents for Annual Exam. Breast and pap with OBGYN. Last mammogram 04/2013. So questions or concerns today. Current medications are working well for her mood, and she denies adverse effects.   Patient Active Problem List   Diagnosis Date Noted  . Dyspepsia and disorder of function of stomach 01/16/2014  . Hyperlipidemia 03/07/2012  . Depression 03/07/2012  . Immune to varicella 03/07/2006    Prior to Admission medications   Medication Sig Start Date End Date Taking? Authorizing Provider  FLUoxetine (PROZAC) 40 MG capsule Take 1 capsule (40 mg total) by mouth daily. 11/09/13  Yes Thao P Le, DO  GILDESS 1/20 1-20 MG-MCG tablet  08/16/11  Yes Historical Provider, MD  pravastatin (PRAVACHOL) 40 MG tablet Take 1 tablet (40 mg total) by mouth daily. 11/09/13  Yes Thao P Le, DO  traZODone (DESYREL) 50 MG tablet Take 1 tablet (50 mg total) by mouth at bedtime. 11/09/13  Yes Thao P Le, DO    No Known Allergies  History   Social History  . Marital Status: Married    Spouse Name: Shanon Brow  . Number of Children: 2  . Years of Education: 14   Occupational History  . RN     [employer]   Social History Main Topics  . Smoking status: Never Smoker   . Smokeless tobacco: Never Used  . Alcohol Use: 0.0 - 0.5 oz/week    0-1 drink(s) per week  . Drug Use: No  . Sexual Activity:    Partners: Male    Birth Control/ Protection: Pill   Other Topics Concern  . Not on file   Social History Narrative   Lives with husband and 2 children.     Review of Systems  Constitutional: Negative.   HENT: Negative.   Eyes: Negative.   Respiratory: Negative.   Cardiovascular: Negative.   Gastrointestinal: Negative.   Endocrine: Negative.   Genitourinary: Negative.     Musculoskeletal: Negative.   Skin: Negative.   Allergic/Immunologic: Negative.   Neurological: Negative.   Hematological: Negative.   Psychiatric/Behavioral: Negative.        Objective:   Physical Exam  Constitutional: She is oriented to person, place, and time. Vital signs are normal. She appears well-developed and well-nourished. She is active and cooperative. No distress.  BP 125/80 mmHg  Pulse 71  Temp(Src) 98.2 F (36.8 C) (Oral)  Resp 16  Ht 5' 7.5" (1.715 m)  Wt 187 lb 9.6 oz (85.095 kg)  BMI 28.93 kg/m2  SpO2 100%   HENT:  Head: Normocephalic and atraumatic.  Right Ear: Hearing, tympanic membrane, external ear and ear canal normal. No foreign bodies.  Left Ear: Hearing, tympanic membrane, external ear and ear canal normal. No foreign bodies.  Nose: Nose normal.  Mouth/Throat: Uvula is midline, oropharynx is clear and moist and mucous membranes are normal. No oral lesions. Normal dentition. No dental abscesses or uvula swelling. No oropharyngeal exudate.  Eyes: Conjunctivae, EOM and lids are normal. Pupils are equal, round, and reactive to light. Right eye exhibits no discharge. Left eye exhibits no discharge. No scleral icterus.  Fundoscopic exam:      The right eye shows no arteriolar narrowing, no AV nicking, no exudate, no hemorrhage and no papilledema. The right  eye shows red reflex.       The left eye shows no arteriolar narrowing, no AV nicking, no exudate, no hemorrhage and no papilledema. The left eye shows red reflex.  Neck: Trachea normal, normal range of motion and full passive range of motion without pain. Neck supple. No spinous process tenderness and no muscular tenderness present. No thyroid mass and no thyromegaly present.  Cardiovascular: Normal rate, regular rhythm, normal heart sounds, intact distal pulses and normal pulses.   Pulmonary/Chest: Effort normal and breath sounds normal.  Musculoskeletal: She exhibits no edema or tenderness.       Cervical  back: Normal.       Thoracic back: Normal.       Lumbar back: Normal.  Lymphadenopathy:       Head (right side): No tonsillar, no preauricular, no posterior auricular and no occipital adenopathy present.       Head (left side): No tonsillar, no preauricular, no posterior auricular and no occipital adenopathy present.    She has no cervical adenopathy.       Right: No supraclavicular adenopathy present.       Left: No supraclavicular adenopathy present.  Neurological: She is alert and oriented to person, place, and time. She has normal strength and normal reflexes. No cranial nerve deficit. She exhibits normal muscle tone. Coordination and gait normal.  Skin: Skin is warm, dry and intact. No rash noted. She is not diaphoretic. No cyanosis or erythema. Nails show no clubbing.  Psychiatric: She has a normal mood and affect. Her speech is normal and behavior is normal. Judgment and thought content normal.          Assessment & Plan:  1. Annual physical exam Age appropriate anticipatory guidance provided.  2. Screening for HIV (human immunodeficiency virus) - HIV antibody  3. Hyperlipidemia Await lab results. Adjust regimen if indicated. Healthy lifestyle changes encouraged. - Comprehensive metabolic panel - Lipid panel - pravastatin (PRAVACHOL) 40 MG tablet; Take 1 tablet (40 mg total) by mouth daily.  Dispense: 90 tablet; Refill: 3  4. Depression Stable/controlled. COntinue current treatment. - traZODone (DESYREL) 50 MG tablet; Take 1 tablet (50 mg total) by mouth at bedtime.  Dispense: 90 tablet; Refill: 3 - FLUoxetine (PROZAC) 40 MG capsule; Take 1 capsule (40 mg total) by mouth daily.  Dispense: 90 capsule; Refill: 3   Fara Chute, PA-C Physician Assistant-Certified [facility]

## 2014-10-02 LAB — HIV ANTIBODY (ROUTINE TESTING W REFLEX): HIV 1&2 Ab, 4th Generation: NONREACTIVE

## 2015-03-15 ENCOUNTER — Observation Stay (HOSPITAL_COMMUNITY)
Admission: EM | Admit: 2015-03-15 | Discharge: 2015-03-15 | Disposition: A | Discharge status: Home / Self Care | Payer: 59 | Attending: Emergency Medicine | Admitting: Emergency Medicine

## 2015-03-15 ENCOUNTER — Emergency Department (HOSPITAL_COMMUNITY): Payer: 59

## 2015-03-15 ENCOUNTER — Encounter (HOSPITAL_COMMUNITY): Payer: Self-pay

## 2015-03-15 DIAGNOSIS — K8020 Calculus of gallbladder without cholecystitis without obstruction: Principal | ICD-10-CM | POA: Insufficient documentation

## 2015-03-15 DIAGNOSIS — K8010 Calculus of gallbladder with chronic cholecystitis without obstruction: Secondary | ICD-10-CM | POA: Diagnosis present

## 2015-03-15 DIAGNOSIS — F329 Major depressive disorder, single episode, unspecified: Secondary | ICD-10-CM | POA: Insufficient documentation

## 2015-03-15 DIAGNOSIS — Z79899 Other long term (current) drug therapy: Secondary | ICD-10-CM | POA: Diagnosis not present

## 2015-03-15 DIAGNOSIS — R1011 Right upper quadrant pain: Secondary | ICD-10-CM

## 2015-03-15 DIAGNOSIS — E785 Hyperlipidemia, unspecified: Secondary | ICD-10-CM | POA: Insufficient documentation

## 2015-03-15 HISTORY — DX: Calculus of gallbladder with chronic cholecystitis without obstruction: K80.10

## 2015-03-15 LAB — URINE MICROSCOPIC-ADD ON

## 2015-03-15 LAB — URINALYSIS, ROUTINE W REFLEX MICROSCOPIC
BILIRUBIN URINE: NEGATIVE
GLUCOSE, UA: NEGATIVE mg/dL
KETONES UR: NEGATIVE mg/dL
NITRITE: NEGATIVE
PH: 5.5 (ref 5.0–8.0)
PROTEIN: 30 mg/dL — AB
Specific Gravity, Urine: 1.025 (ref 1.005–1.030)

## 2015-03-15 LAB — COMPREHENSIVE METABOLIC PANEL
ALBUMIN: 4.1 g/dL (ref 3.5–5.0)
ALK PHOS: 66 U/L (ref 38–126)
ALT: 23 U/L (ref 14–54)
AST: 44 U/L — AB (ref 15–41)
Anion gap: 10 (ref 5–15)
BILIRUBIN TOTAL: 0.6 mg/dL (ref 0.3–1.2)
BUN: 14 mg/dL (ref 6–20)
CO2: 24 mmol/L (ref 22–32)
CREATININE: 1.01 mg/dL — AB (ref 0.44–1.00)
Calcium: 9.2 mg/dL (ref 8.9–10.3)
Chloride: 101 mmol/L (ref 101–111)
GFR calc Af Amer: >60 mL/min (ref >60)
GLUCOSE: 146 mg/dL — AB (ref 65–99)
POTASSIUM: 4.2 mmol/L (ref 3.5–5.1)
Sodium: 135 mmol/L (ref 135–145)
TOTAL PROTEIN: 7.4 g/dL (ref 6.5–8.1)

## 2015-03-15 LAB — CBC
HEMATOCRIT: 39.9 % (ref 36.0–46.0)
Hemoglobin: 13.3 g/dL (ref 12.0–15.0)
MCH: 29.2 pg (ref 26.0–34.0)
MCHC: 33.3 g/dL (ref 30.0–36.0)
MCV: 87.5 fL (ref 78.0–100.0)
PLATELETS: 258 10*3/uL (ref 150–400)
RBC: 4.56 MIL/uL (ref 3.87–5.11)
RDW: 12.8 % (ref 11.5–15.5)
WBC: 12.9 10*3/uL — AB (ref 4.0–10.5)

## 2015-03-15 LAB — LIPASE, BLOOD: Lipase: 43 U/L (ref 11–51)

## 2015-03-15 MED ORDER — ONDANSETRON HCL 4 MG PO TABS: 4.0000 mg | ORAL_TABLET | Freq: Three times a day (TID) | ORAL | Status: DC | PRN

## 2015-03-15 MED ORDER — AMOXICILLIN-POT CLAVULANATE 875-125 MG PO TABS
1.0000 | ORAL_TABLET | Freq: Once | ORAL | Status: AC
  Administered 2015-03-15: 1 via ORAL
  Filled 2015-03-15: qty 1

## 2015-03-15 MED ORDER — HYDROCODONE-ACETAMINOPHEN 5-325 MG PO TABS: 1.0000 | ORAL_TABLET | ORAL | Status: DC | PRN

## 2015-03-15 MED ORDER — AMOXICILLIN-POT CLAVULANATE 875-125 MG PO TABS: 1.0000 | ORAL_TABLET | Freq: Two times a day (BID) | ORAL | Status: DC

## 2015-03-15 NOTE — Discharge Instructions (Signed)
Biliary Colic °Biliary colic is a pain in the upper abdomen. The pain: °· Is usually felt on the right side of the abdomen, but it may also be felt in the center of the abdomen, just below the breastbone (sternum). °· May spread back toward the right shoulder blade. °· May be steady or irregular. °· May be accompanied by nausea and vomiting. °Most of the time, the pain goes away in 1-5 hours. After the most intense pain passes, the abdomen may continue to ache mildly for about 24 hours. °Biliary colic is caused by a blockage in the bile duct. The bile duct is a pathway that carries bile--a liquid that helps to digest fats--from the gallbladder to the small intestine. Biliary colic usually occurs after eating, when the digestive system demands bile. The pain develops when muscle cells contract forcefully to try to move the blockage so that bile can get by. °HOME CARE INSTRUCTIONS °· Take medicines only as directed by your health care provider. °· Drink enough fluid to keep your urine clear or pale yellow. °· Avoid fatty, greasy, and fried foods. These kinds of foods increase your body's demand for bile. °· Avoid any foods that make your pain worse. °· Avoid overeating. °· Avoid having a large meal after fasting. °SEEK MEDICAL CARE IF: °· You develop a fever. °· Your pain gets worse. °· You vomit. °· You develop nausea that prevents you from eating and drinking. °SEEK IMMEDIATE MEDICAL CARE IF: °· You suddenly develop a fever and shaking chills. °· You develop a yellowish discoloration (jaundice) of: °¨ Skin. °¨ Whites of the eyes. °¨ Mucous membranes. °· You have continuous or severe pain that is not relieved with medicines. °· You have nausea and vomiting that is not relieved with medicines. °· You develop dizziness or you faint. °  °This information is not intended to replace advice given to you by your health care provider. Make sure you discuss any questions you have with your health care provider. °  °Document  Released: 09/06/2005 Document Revised: 08/20/2014 Document Reviewed: 01/15/2014 °Elsevier Interactive Patient Education ©2016 [employer] ° °

## 2015-03-15 NOTE — ED Provider Notes (Signed)
CSN: [id number]     Arrival date & time 03/15/15  [id number] History  By signing my name below, I, Laurie Bond, attest that this documentation has been prepared under the direction and in the presence of Laurie Rice, MD . Electronically Signed: Evelene Bond, Scribe. 03/15/2015. 3:47 AM.   Chief Complaint  Patient presents with  . Abdominal Pain    The history is provided by the patient. No language interpreter was used.    HPI Comments:  Laurie Bond is a 44 y.o. female who presents to the Emergency Department complaining of  sudden onset, sharp, RUQ abdominal pain that began ~ 0000 this AM. Her pain radiates around to her back and is exacerbated with deep breathe. She reports one episode of associated vomiting after symptom onset. Pt last ate ~ 2100 last night - pumpkin pie. She denies nausea at this time, fever, dysuria, hematuria, and urinary urgency. She also denies h/o same. Pt reports h/o abdominal surgery-tubal ligation. She reports taking pepto bismol PTA;  notes at this time her pain has improved.  Past Medical History  Diagnosis Date  . Depression   . Hyperlipidemia   . Pre-eclampsia 2004  . Obesity   . Pre-eclampsia 2004    1st of 2 pregnancies  . Gestational diabetes 2010    2nd of 2 pregnancies  . Dyspepsia and disorder of function of stomach 01/16/2014    Mild reflux symptoms, effectively reduced with Prilosec.    Past Surgical History  Procedure Laterality Date  . Tubal ligation    . Wisdom tooth extraction     Family History  Problem Relation Age of Onset  . Cancer Mother     skin cancer  . Hypertension Mother   . Deep vein thrombosis Father   . Cancer Father     prostate  . Hypertension Father   . Alcohol abuse Sister    Social History  Substance Use Topics  . Smoking status: Never Smoker   . Smokeless tobacco: Never Used  . Alcohol Use: 0.0 - 0.5 oz/week    0-1 drink(s) per week   OB History    No data available     Review of Systems   Constitutional: Negative for fever and chills.  Respiratory: Negative for cough and shortness of breath.   Cardiovascular: Negative for chest pain.  Gastrointestinal: Positive for nausea, vomiting and abdominal pain. Negative for diarrhea and constipation.  Genitourinary: Negative for dysuria, urgency, hematuria, flank pain and difficulty urinating.  Musculoskeletal: Positive for back pain. Negative for neck pain and neck stiffness.  Skin: Negative for rash and wound.  Neurological: Negative for dizziness, weakness, light-headedness, numbness and headaches.  All other systems reviewed and are negative.   Allergies  Review of patient's allergies indicates no known allergies.  Home Medications   Prior to Admission medications   Medication Sig Start Date End Date Taking? Authorizing Provider  FLUoxetine (PROZAC) 40 MG capsule Take 1 capsule (40 mg total) by mouth daily. 10/01/14  Yes [name] Jeffery, PA-C  GILDESS 1/20 1-20 MG-MCG tablet Take 1 tablet by mouth daily.  08/16/11  Yes Historical Provider, MD  pravastatin (PRAVACHOL) 40 MG tablet Take 1 tablet (40 mg total) by mouth daily. 10/01/14  Yes [name] Jeffery, PA-C  traZODone (DESYREL) 50 MG tablet Take 1 tablet (50 mg total) by mouth at bedtime. 10/01/14  Yes [name] Jeffery, PA-C  amoxicillin-clavulanate (AUGMENTIN) 875-125 MG tablet Take 1 tablet by mouth 2 (two) times daily. 03/15/15   Laurie Rice,  MD  HYDROcodone-acetaminophen (NORCO) 5-325 MG tablet Take 1-2 tablets by mouth every 4 (four) hours as needed for moderate pain or severe pain. 03/15/15   Laurie Rice, MD  ondansetron (ZOFRAN) 4 MG tablet Take 1 tablet (4 mg total) by mouth every 8 (eight) hours as needed for nausea or vomiting. 03/15/15   Laurie Rice, MD   BP 140/90 mmHg  Pulse 92  Temp(Src) 98 F (36.7 C) (Oral)  Resp 18  Ht 5' 7.5" (1.715 m)  Wt 195 lb (88.451 kg)  BMI 30.07 kg/m2  SpO2 99% Physical Exam  Constitutional: She is oriented to person,  place, and time. She appears well-developed and well-nourished. No distress.  HENT:  Head: Normocephalic and atraumatic.  Mouth/Throat: Oropharynx is clear and moist.  Eyes: EOM are normal. Pupils are equal, round, and reactive to light.  Neck: Normal range of motion. Neck supple.  Cardiovascular: Normal rate and regular rhythm.   Pulmonary/Chest: Effort normal and breath sounds normal. No respiratory distress. She has no wheezes. She has no rales. She exhibits no tenderness.  Abdominal: Soft. Bowel sounds are normal. She exhibits no distension and no mass. There is tenderness (tenderness to palpation in the right upper quadrant and epigastric regions.). There is no rebound and no guarding.  Musculoskeletal: Normal range of motion. She exhibits no edema or tenderness.  No CVA tenderness bilaterally.  Neurological: She is alert and oriented to person, place, and time.  Moves all extremities without deficit. Sensation is fully intact.  Skin: Skin is warm and dry. No rash noted. No erythema.  Psychiatric: She has a normal mood and affect. Her behavior is normal.  Nursing note and vitals reviewed.   ED Course  Procedures   DIAGNOSTIC STUDIES:  Oxygen Saturation is 99% on RA, normal by my interpretation.    COORDINATION OF CARE:  3:27 AM Discussed treatment plan with pt at bedside and pt agreed to plan.  Labs Review Labs Reviewed  COMPREHENSIVE METABOLIC PANEL - Abnormal; Notable for the following:    Glucose, Bld 146 (*)    Creatinine, Ser 1.01 (*)    AST 44 (*)    All other components within normal limits  CBC - Abnormal; Notable for the following:    WBC 12.9 (*)    All other components within normal limits  URINALYSIS, ROUTINE W REFLEX MICROSCOPIC (NOT AT [facility]) - Abnormal; Notable for the following:    APPearance TURBID (*)    Hgb urine dipstick LARGE (*)    Protein, ur 30 (*)    Leukocytes, UA SMALL (*)    All other components within normal limits  URINE MICROSCOPIC-ADD  ON - Abnormal; Notable for the following:    Squamous Epithelial / LPF 6-30 (*)    Bacteria, UA FEW (*)    All other components within normal limits  LIPASE, BLOOD    Imaging Review US Abdomen Limited Ruq  03/15/2015  CLINICAL DATA:  Acute onset of right upper quadrant abdominal pain. Initial encounter. EXAM: US ABDOMEN LIMITED - RIGHT UPPER QUADRANT COMPARISON:  Right upper quadrant ultrasound performed 12/28/2013 FINDINGS: Gallbladder: Sludge and stones are noted within the gallbladder. Mild gallbladder wall thickening is noted, measuring up to 4 mm. No pericholecystic fluid is seen. No ultrasonographic Murphy's sign is elicited. Common bile duct: Diameter: 0.4 cm, within normal limits in caliber. Liver: No focal lesion identified. Within normal limits in parenchymal echogenicity. IMPRESSION: Sludge and stones within the gallbladder. Mild gallbladder wall thickening may reflect mild chronic inflammation. No  definite evidence for obstruction or acute cholecystitis. Electronically Signed   By: Garald Balding M.D.   On: 03/15/2015 04:26   I have personally reviewed and evaluated these images and lab results as part of my medical decision-making.   EKG Interpretation None      MDM   Final diagnoses:  RUQ pain    I personally performed the services described in this documentation, which was scribed in my presence. The recorded information has been reviewed and is accurate.   Patient states her pain is improved. She does have some mild tenderness in the right upper quadrant with deep palpation. No rebound or guarding. Mild gallbladder wall thickening on ultrasound. Patient also has a elevation of white blood cell count. Discussed with Dr. Ninfa Linden. Admission offered inpatient initially accepted. She then changed her mind stating she would rather discharge home and follow-up as an outpatient. Dr. Ninfa Linden suggested starting the patient on Augmentin. Given the first dose in the emergency  department. Patient is being given very strict return precautions for any worsening of her pain, persistent vomiting, fever or for any concerns.   Laurie Rice, MD 03/15/15 [phone]

## 2015-03-15 NOTE — ED Notes (Signed)
Pt called out with call bell. Upon entering, pt stated she changed her mind and does not want to be admitted. She stated she would rather plan surgery outpatient so plans can be made for her children. Informed Dr. Lita Mains of patient's request.

## 2015-03-15 NOTE — ED Notes (Addendum)
Pt off unit for US

## 2015-03-15 NOTE — ED Notes (Signed)
Pt woke up with abd pain and vomiting, pt was fine and woke up from sleep with pain,

## 2015-03-19 ENCOUNTER — Other Ambulatory Visit: Payer: Self-pay | Admitting: Surgery

## 2015-04-08 ENCOUNTER — Other Ambulatory Visit: Payer: Self-pay | Admitting: Surgery

## 2015-10-16 ENCOUNTER — Other Ambulatory Visit: Payer: Self-pay | Admitting: Physician Assistant

## 2015-12-31 ENCOUNTER — Ambulatory Visit (INDEPENDENT_AMBULATORY_CARE_PROVIDER_SITE_OTHER): Payer: 59 | Admitting: Family Medicine

## 2015-12-31 ENCOUNTER — Encounter: Payer: Self-pay | Admitting: Family Medicine

## 2015-12-31 VITALS — BP 136/84 | HR 102 | Temp 98.4°F | Resp 18 | Ht 67.0 in | Wt 195.0 lb

## 2015-12-31 DIAGNOSIS — H6091 Unspecified otitis externa, right ear: Secondary | ICD-10-CM | POA: Diagnosis not present

## 2015-12-31 MED ORDER — NEOMYCIN-POLYMYXIN-HC 1 % OT SOLN
4.0000 [drp] | Freq: Four times a day (QID) | OTIC | Status: DC
Start: 2015-12-31 — End: 2015-12-31

## 2015-12-31 MED ORDER — NEOMYCIN-POLYMYXIN-HC 3.5-10000-1 OT SOLN: 4.0000 [drp] | Freq: Four times a day (QID) | OTIC | 0 refills | Status: DC

## 2015-12-31 NOTE — Progress Notes (Signed)
   Laurie Bond is a 45 y.o. female who presents to [facility] today for Right ear pressure:  1.  Right ear pressure:  Present for the past present for the past 2-3 weeks. States that she went swimming at that time and has had sensation of "clogged ears" since then. She does have some occasional pain in that ear. She has noticed some clear liquid draining from the ear. No changes in hearing. No dizziness. No fevers or chills. No sinus congestion. No runny nose or cough.  ROS as above.    PMH reviewed. Patient is a nonsmoker.   Past Medical History:  Diagnosis Date  . Depression   . Dyspepsia and disorder of function of stomach 01/16/2014   Mild reflux symptoms, effectively reduced with Prilosec.   Marland Kitchen Gestational diabetes 2010   2nd of 2 pregnancies  . Hyperlipidemia   . Obesity   . Pre-eclampsia 2004  . Pre-eclampsia 2004   1st of 2 pregnancies   Past Surgical History:  Procedure Laterality Date  . TUBAL LIGATION    . WISDOM TOOTH EXTRACTION      Medications reviewed. Current Outpatient Prescriptions  Medication Sig Dispense Refill  . FLUoxetine (PROZAC) 40 MG capsule take 1 capsule by mouth once daily 90 capsule 0  . GILDESS 1/20 1-20 MG-MCG tablet Take 1 tablet by mouth daily.     . traZODone (DESYREL) 50 MG tablet take 1 tablet by mouth at bedtime 90 tablet 0  . HYDROcodone-acetaminophen (NORCO) 5-325 MG tablet Take 1-2 tablets by mouth every 4 (four) hours as needed for moderate pain or severe pain. (Patient not taking: Reported on 12/31/2015) 10 tablet 0  . pravastatin (PRAVACHOL) 40 MG tablet Take 1 tablet (40 mg total) by mouth daily. (Patient not taking: Reported on 12/31/2015) 90 tablet 3   No current facility-administered medications for this visit.      Physical Exam:  BP 136/84 (BP Location: Right Arm, Patient Position: Sitting, Cuff Size: Small)   Pulse (!) 102   Temp 98.4 F (36.9 C) (Oral)   Resp 18   Ht 5\' 7"  (1.702 m)   Wt 195 lb  (88.5 kg)   SpO2 97%   BMI 30.54 kg/m  Gen:  Patient sitting on exam table, appears stated age in no acute distress Head: Normocephalic atraumatic Eyes: EOMI, PERRL, sclera and conjunctiva non-erythematous Ears:  Canals clear on the left.  Right with swelling and purulent mateiral in canal.  Unable to visualize TM.  No tragal pain.  Nose:  Nasal turbinates grossly enlarged bilaterally. Some exudates noted. Tender to palpation of maxillary sinus  Mouth: Mucosa membranes moist. Tonsils +2, nonenlarged, non-erythematous. Neck: No cervical lymphadenopathy noted Heart:  RRR, no murmurs auscultated. Pulm:  Clear to auscultation bilaterally with good air movement.  No wheezes or rales noted.      Assessment and Plan:  1.  Otitis externa: - treat with cortisporin otic - FU PRN - warning precautions/precautions about otitis media provided.

## 2015-12-31 NOTE — Patient Instructions (Addendum)
You have an outer ear infection.  Use the cortisporin otic in your Right year 4 drops 4 times a day.    This should help with the eustachian tube dysfunction once the pressure normalizes.    IF you aren't getting better, start having fevers and chills, increasing pressure in your ear, come back and see [place].    Otitis Externa Otitis externa is a bacterial or fungal infection of the outer ear canal. This is the area from the eardrum to the outside of the ear. Otitis externa is sometimes called "swimmer's ear." CAUSES  Possible causes of infection include:  Swimming in dirty water.  Moisture remaining in the ear after swimming or bathing.  Mild injury (trauma) to the ear.  Objects stuck in the ear (foreign body).  Cuts or scrapes (abrasions) on the outside of the ear. SIGNS AND SYMPTOMS  The first symptom of infection is often itching in the ear canal. Later signs and symptoms may include swelling and redness of the ear canal, ear pain, and yellowish-white fluid (pus) coming from the ear. The ear pain may be worse when pulling on the earlobe. DIAGNOSIS  Your health care provider will perform a physical exam. A sample of fluid may be taken from the ear and examined for bacteria or fungi. TREATMENT  Antibiotic ear drops are often given for 10 to 14 days. Treatment may also include pain medicine or corticosteroids to reduce itching and swelling. HOME CARE INSTRUCTIONS   Apply antibiotic ear drops to the ear canal as prescribed by your health care provider.  Take medicines only as directed by your health care provider.  If you have diabetes, follow any additional treatment instructions from your health care provider.  Keep all follow-up visits as directed by your health care provider. PREVENTION   Keep your ear dry. Use the corner of a towel to absorb water out of the ear canal after swimming or bathing.  Avoid scratching or putting objects inside your ear. This can damage the ear  canal or remove the protective wax that lines the canal. This makes it easier for bacteria and fungi to grow.  Avoid swimming in lakes, polluted water, or poorly chlorinated pools.  You may use ear drops made of rubbing alcohol and vinegar after swimming. Combine equal parts of white vinegar and alcohol in a bottle. Put 3 or 4 drops into each ear after swimming. SEEK MEDICAL CARE IF:   You have a fever.  Your ear is still red, swollen, painful, or draining pus after 3 days.  Your redness, swelling, or pain gets worse.  You have a severe headache.  You have redness, swelling, pain, or tenderness in the area behind your ear. MAKE SURE YOU:   Understand these instructions.  Will watch your condition.  Will get help right away if you are not doing well or get worse.   This information is not intended to replace advice given to you by your health care provider. Make sure you discuss any questions you have with your health care provider.   Document Released: 04/05/2005 Document Revised: 04/26/2014 Document Reviewed: 04/22/2011 Elsevier Interactive Patient Education 2016 [employer].     IF you received an x-ray today, you will receive an invoice from [facility]. Please contact [facility] at [phone] with questions or concerns regarding your invoice.   IF you received labwork today, you will receive an invoice from [employer]. Please contact [facility] at [phone] with questions or concerns regarding your  invoice.   Our billing staff will not be able to assist you with questions regarding bills from these companies.  You will be contacted with the lab results as soon as they are available. The fastest way to get your results is to activate your My Chart account. Instructions are located on the last page of this paperwork. If you have not heard from [place] regarding the results in 2 weeks, please contact this office.

## 2016-01-27 ENCOUNTER — Encounter: Payer: Self-pay | Admitting: Physician Assistant

## 2016-01-27 ENCOUNTER — Ambulatory Visit (INDEPENDENT_AMBULATORY_CARE_PROVIDER_SITE_OTHER): Payer: 59 | Admitting: Physician Assistant

## 2016-01-27 VITALS — BP 126/84 | HR 79 | Temp 98.2°F | Resp 18 | Ht 67.0 in | Wt 198.2 lb

## 2016-01-27 DIAGNOSIS — Z0000 Encounter for general adult medical examination without abnormal findings: Secondary | ICD-10-CM

## 2016-01-27 DIAGNOSIS — Z130 Encounter for screening for diseases of the blood and blood-forming organs and certain disorders involving the immune mechanism: Secondary | ICD-10-CM

## 2016-01-27 DIAGNOSIS — Z1389 Encounter for screening for other disorder: Secondary | ICD-10-CM | POA: Diagnosis not present

## 2016-01-27 DIAGNOSIS — R8271 Bacteriuria: Secondary | ICD-10-CM

## 2016-01-27 DIAGNOSIS — E785 Hyperlipidemia, unspecified: Secondary | ICD-10-CM

## 2016-01-27 DIAGNOSIS — Z23 Encounter for immunization: Secondary | ICD-10-CM | POA: Diagnosis not present

## 2016-01-27 DIAGNOSIS — R3121 Asymptomatic microscopic hematuria: Secondary | ICD-10-CM

## 2016-01-27 DIAGNOSIS — F32A Depression, unspecified: Secondary | ICD-10-CM

## 2016-01-27 DIAGNOSIS — G4700 Insomnia, unspecified: Secondary | ICD-10-CM

## 2016-01-27 DIAGNOSIS — F329 Major depressive disorder, single episode, unspecified: Secondary | ICD-10-CM

## 2016-01-27 LAB — POCT URINALYSIS DIP (MANUAL ENTRY)
BILIRUBIN UA: NEGATIVE
BILIRUBIN UA: NEGATIVE
GLUCOSE UA: NEGATIVE
LEUKOCYTES UA: NEGATIVE
Nitrite, UA: NEGATIVE
PH UA: 7
Protein Ur, POC: NEGATIVE
Spec Grav, UA: 1.02
Urobilinogen, UA: 0.2

## 2016-01-27 LAB — CBC WITH DIFFERENTIAL/PLATELET
BASOS ABS: 0 {cells}/uL (ref 0–200)
BASOS PCT: 0 %
EOS ABS: 228 {cells}/uL (ref 15–500)
Eosinophils Relative: 3 %
HEMATOCRIT: 36.7 % (ref 35.0–45.0)
HEMOGLOBIN: 12.2 g/dL (ref 11.7–15.5)
LYMPHS ABS: 1672 {cells}/uL (ref 850–3900)
Lymphocytes Relative: 22 %
MCH: 28.3 pg (ref 27.0–33.0)
MCHC: 33.2 g/dL (ref 32.0–36.0)
MCV: 85.2 fL (ref 80.0–100.0)
MONO ABS: 456 {cells}/uL (ref 200–950)
MPV: 8.9 fL (ref 7.5–12.5)
Monocytes Relative: 6 %
NEUTROS ABS: 5244 {cells}/uL (ref 1500–7800)
Neutrophils Relative %: 69 %
PLATELETS: 277 10*3/uL (ref 140–400)
RBC: 4.31 MIL/uL (ref 3.80–5.10)
RDW: 13.2 % (ref 11.0–15.0)
WBC: 7.6 10*3/uL (ref 3.8–10.8)

## 2016-01-27 LAB — COMPREHENSIVE METABOLIC PANEL
ALBUMIN: 3.9 g/dL (ref 3.6–5.1)
ALK PHOS: 65 U/L (ref 33–115)
ALT: 13 U/L (ref 6–29)
AST: 17 U/L (ref 10–35)
BUN: 9 mg/dL (ref 7–25)
CALCIUM: 9 mg/dL (ref 8.6–10.2)
CO2: 24 mmol/L (ref 20–31)
Chloride: 102 mmol/L (ref 98–110)
Creat: 0.76 mg/dL (ref 0.50–1.10)
GLUCOSE: 78 mg/dL (ref 65–99)
POTASSIUM: 4.5 mmol/L (ref 3.5–5.3)
Sodium: 136 mmol/L (ref 135–146)
TOTAL PROTEIN: 6.8 g/dL (ref 6.1–8.1)
Total Bilirubin: 0.4 mg/dL (ref 0.2–1.2)

## 2016-01-27 LAB — LIPID PANEL
CHOLESTEROL: 213 mg/dL — AB (ref 125–200)
HDL: 49 mg/dL (ref >=46)
LDL Cholesterol: 121 mg/dL (ref <130)
TRIGLYCERIDES: 217 mg/dL — AB (ref <150)
Total CHOL/HDL Ratio: 4.3 Ratio (ref <=5.0)
VLDL: 43 mg/dL — AB (ref <30)

## 2016-01-27 MED ORDER — FLUOXETINE HCL 40 MG PO CAPS: 40.0000 mg | ORAL_CAPSULE | Freq: Every day | ORAL | 3 refills | Status: DC

## 2016-01-27 MED ORDER — NIACIN ER 250 MG PO CPCR: 250.0000 mg | ORAL_CAPSULE | Freq: Every day | ORAL | 3 refills | Status: DC

## 2016-01-27 MED ORDER — TRAZODONE HCL 50 MG PO TABS: 50.0000 mg | ORAL_TABLET | Freq: Every day | ORAL | 3 refills | Status: DC

## 2016-01-27 NOTE — Progress Notes (Signed)
Patient ID: Laurie Bond, female    DOB: Aug 09, 1970, 45 y.o.   MRN: [id number]  PCP: Harrison Mons, PA-C  Chief Complaint  Patient presents with  . Annual Exam    CPE. No Pap needed    Subjective:   HPI: Presents for [employer].  She reports doing well, no new problems. She does need refills of the fluoxetine and trazodone. In addition, she did not tolerate the pravastatin (severe myalgias) and wonders what other options she has.  Cervical Cancer Screening: with GYN Breast Cancer Screening: with Gyn Colorectal Cancer Screening: not yet a candidate Bone Density Testing: not yet a candidate HIV Screening: completed. Negative result in 2016 STI Screening: very low risk Seasonal Influenza Vaccination: already received elsewhere Td/Tdap Vaccination: 01/2006. Will receive booster today. Pneumococcal Vaccination: not yet a candidate Zoster Vaccination: not yet a candidate     Patient Active Problem List   Diagnosis Date Noted  . Insomnia 01/27/2016  . Cholecystitis with cholelithiasis 03/15/2015  . Hyperlipidemia 03/07/2012  . Depression 03/07/2012  . Immune to varicella 03/07/2006    Past Medical History:  Diagnosis Date  . Depression   . Dyspepsia and disorder of function of stomach 01/16/2014   Mild reflux symptoms, effectively reduced with Prilosec.   Marland Kitchen Gestational diabetes 2010   2nd of 2 pregnancies  . Hyperlipidemia   . Obesity   . Pre-eclampsia 2004  . Pre-eclampsia 2004   1st of 2 pregnancies     Prior to Admission medications   Medication Sig Start Date End Date Taking? Authorizing Provider  FLUoxetine (PROZAC) 40 MG capsule take 1 capsule by mouth once daily 10/18/15  Yes Lizzett Nobile, PA-C  GILDESS 1/20 1-20 MG-MCG tablet Take 1 tablet by mouth daily.  08/16/11  Yes Historical Provider, MD  traZODone (DESYREL) 50 MG tablet take 1 tablet by mouth at bedtime 10/18/15  Yes Thaine Garriga, PA-C    Allergies  Allergen Reactions  .  Pravastatin Other (See Comments)    Severe myalgias    Past Surgical History:  Procedure Laterality Date  . TUBAL LIGATION    . WISDOM TOOTH EXTRACTION      Family History  Problem Relation Age of Onset  . Cancer Mother     skin cancer  . Hypertension Mother   . Deep vein thrombosis Father   . Cancer Father     prostate  . Hypertension Father   . Alcohol abuse Sister     Social History   Social History  . Marital status: Married    Spouse name: Shanon Brow  . Number of children: 2  . Years of education: 14   Occupational History  . 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 Smoking status: Never Smoker  . Smokeless tobacco: Never Used  . Alcohol use 0.0 - 0.5 oz/week  . Drug use: No  . Sexual activity: Yes    Partners: Male    Birth control/ protection: Pill   Other Topics Concern  . None   Social History Narrative   Lives with husband and 2 children.       Review of Systems  Constitutional: Negative.   HENT: Negative.   Eyes: Negative.   Respiratory: Negative.   Cardiovascular: Negative.   Gastrointestinal: Negative.   Genitourinary: Negative.   Musculoskeletal: Negative.   Skin: Negative.   Neurological: Negative.   Psychiatric/Behavioral: Negative.         Objective:  Physical Exam  Constitutional: She is oriented to person, place, and time. Vital signs are normal. She appears well-developed and well-nourished. She is active and cooperative. No distress.  BP 126/84   Pulse 79   Temp 98.2 F (36.8 C) (Oral)   Resp 18   Ht 5\' 7"  (1.702 m)   Wt 198 lb 3.2 oz (89.9 kg)   SpO2 99%   BMI 31.04 kg/m    HENT:  Head: Normocephalic and atraumatic.  Right Ear: Hearing, tympanic membrane, external ear and ear canal normal. No foreign bodies.  Left Ear: Hearing, tympanic membrane, external ear and ear canal normal. No foreign bodies.  Nose: Nose normal.  Mouth/Throat: Uvula is midline, oropharynx is clear and moist  and mucous membranes are normal. No oral lesions. Normal dentition. No dental abscesses or uvula swelling. No oropharyngeal exudate.  Eyes: Conjunctivae, EOM and lids are normal. Pupils are equal, round, and reactive to light. Right eye exhibits no discharge. Left eye exhibits no discharge. No scleral icterus.  Fundoscopic exam:      The right eye shows no arteriolar narrowing, no AV nicking, no exudate, no hemorrhage and no papilledema. The right eye shows red reflex.       The left eye shows no arteriolar narrowing, no AV nicking, no exudate, no hemorrhage and no papilledema. The left eye shows red reflex.  Neck: Trachea normal, normal range of motion and full passive range of motion without pain. Neck supple. No spinous process tenderness and no muscular tenderness present. No thyroid mass and no thyromegaly present.  Cardiovascular: Normal rate, regular rhythm, normal heart sounds, intact distal pulses and normal pulses.   Pulmonary/Chest: Effort normal and breath sounds normal.  Musculoskeletal: She exhibits no edema or tenderness.       Cervical back: Normal.       Thoracic back: Normal.       Lumbar back: Normal.  Lymphadenopathy:       Head (right side): No tonsillar, no preauricular, no posterior auricular and no occipital adenopathy present.       Head (left side): No tonsillar, no preauricular, no posterior auricular and no occipital adenopathy present.    She has no cervical adenopathy.       Right: No supraclavicular adenopathy present.       Left: No supraclavicular adenopathy present.  Neurological: She is alert and oriented to person, place, and time. She has normal strength and normal reflexes. No cranial nerve deficit. She exhibits normal muscle tone. Coordination and gait normal.  Skin: Skin is warm, dry and intact. No rash noted. She is not diaphoretic. No cyanosis or erythema. Nails show no clubbing.  Psychiatric: She has a normal mood and affect. Her speech is normal and  behavior is normal. Judgment and thought content normal.           Assessment & Plan:  1. Annual physical exam Age appropriate anticipatory guidance provided.  2. Need for Tdap vaccination - Tdap vaccine greater than or equal to 7yo IM  3. Depression, unspecified depression type Stable/controlled. - FLUoxetine (PROZAC) 40 MG capsule; Take 1 capsule (40 mg total) by mouth daily.  Dispense: 90 capsule; Refill: 3  4. Insomnia, unspecified type Stable/controlled. - traZODone (DESYREL) 50 MG tablet; Take 1 tablet (50 mg total) by mouth at bedtime.  Dispense: 90 tablet; Refill: 3  5. Hyperlipidemia, unspecified hyperlipidemia type Await labs. Trial of Niaspan. Take 30 minutes after ASA 81 mg and with apple sauce. - Lipid panel -  Comprehensive metabolic panel - niacin [id number] MG CR capsule; Take 1 capsule (250 mg total) by mouth at bedtime.  Dispense: 90 capsule; Refill: 3  6. Screening for anemia - CBC with Differential/Platelet  7. Screening for blood and protein in the urine - POCT urinalysis dipstick - Urinalysis, microscopic only  8. Asymptomatic microscopic hematuria Await UCx. If no cause for hematuria found, will refer to urology. - Urine culture   Fara Chute, PA-C Physician Assistant-Certified [facility]

## 2016-01-27 NOTE — Progress Notes (Signed)
Subjective:    Patient ID: Laurie Bond, female    DOB: 04/16/1971, 45 y.o.   MRN: [id number] Chief Complaint  Patient presents with  . Annual Exam    CPE. No Pap needed    HPI Presents today for complete wellness exam. Patient reports general good health. Just recently sold her house and moved to a new home. Reports these past few weeks have been stressful due to the move but "now I can finally breath" since her move is complete. Patient has hx of depression, insomnia, and hyperlipidemia. These are generally well controlled on medication, however she stopped taking her pravastatin 9 months ago due to side effects of sever myalgias. Patient would like to know if there are any other lipid lowering medications she could take to replace her statin.   Up to date on vaccines except for Tdap which she wishes to take today. GYN screenings like breast exam and PAP smears are done through OB/GYN with Dr.  Dellis Filbert.   Past Medical History:  Diagnosis Date  . Depression   . Dyspepsia and disorder of function of stomach 01/16/2014   Mild reflux symptoms, effectively reduced with Prilosec.   Marland Kitchen Gestational diabetes 2010   2nd of 2 pregnancies  . Hyperlipidemia   . Obesity   . Pre-eclampsia 2004  . Pre-eclampsia 2004   1st of 2 pregnancies   Family History  Problem Relation Age of Onset  . Cancer Mother     skin cancer  . Hypertension Mother   . Deep vein thrombosis Father   . Cancer Father     prostate  . Hypertension Father   . Alcohol abuse Sister    Social History   Social History  . Marital status: Married    Spouse name: Shanon Brow  . Number of children: 2  . Years of education: 14   Occupational History  . RN     [employer]   Social History Main Topics  . Smoking status: Never Smoker  . Smokeless tobacco: Never Used  . Alcohol use 0.0 - 0.5 oz/week  . Drug use: No  . Sexual activity: Yes    Partners: Male    Birth control/ protection: Pill    Other Topics Concern  . Not on file   Social History Narrative   Lives with husband and 2 children.   Prior to Admission medications   Medication Sig Start Date End Date Taking? Authorizing Provider  FLUoxetine (PROZAC) 40 MG capsule Take 1 capsule (40 mg total) by mouth daily. 01/27/16  Yes [name] Jacqulynn Cadet, PA-C  GILDESS 1/20 1-20 MG-MCG tablet Take 1 tablet by mouth daily.  08/16/11  Yes Historical Provider, MD  traZODone (DESYREL) 50 MG tablet Take 1 tablet (50 mg total) by mouth at bedtime. 01/27/16  Yes [name] Jeffery, PA-C  niacin 250 MG CR capsule Take 1 capsule (250 mg total) by mouth at bedtime. 01/27/16   Harrison Mons, PA-C       Review of Systems All ROS are negative except for those in HPI     Objective:   Physical Exam  Constitutional: She appears well-developed and well-nourished. No distress.  HENT:  Head: Normocephalic and atraumatic.  Right Ear: External ear normal.  Left Ear: External ear normal.  Mouth/Throat: Oropharynx is clear and moist. No oropharyngeal exudate.  Multi-colored serum noted in external ear canal. No erythema or pain on palpation of tragus   Eyes: Pupils are equal, round, and reactive to light. Right  eye exhibits no discharge. Left eye exhibits no discharge.  Neck: Neck supple. No thyromegaly present.  Cardiovascular: Normal rate, regular rhythm, normal heart sounds and intact distal pulses.  Exam reveals no gallop and no friction rub.   No murmur heard. Pulmonary/Chest: Effort normal and breath sounds normal. No respiratory distress. She has no wheezes.  Abdominal: Soft. Bowel sounds are normal. She exhibits no distension and no mass. There is no tenderness.  Musculoskeletal: She exhibits no edema or tenderness.  Lymphadenopathy:    She has no cervical adenopathy.  Neurological: She displays normal reflexes.  Skin: Skin is warm and dry. No rash noted. She is not diaphoretic.  Psychiatric: She has a normal mood and affect. Her behavior  is normal.   BP 126/84   Pulse 79   Temp 98.2 F (36.8 C) (Oral)   Resp 18   Ht 5\' 7"  (1.702 m)   Wt 198 lb 3.2 oz (89.9 kg)   SpO2 99%   BMI 31.04 kg/m   Results for orders placed or performed in visit on 01/27/16  POCT urinalysis dipstick  Result Value Ref Range   Color, UA yellow yellow   Clarity, UA cloudy (A) clear   Glucose, UA negative negative   Bilirubin, UA negative negative   Ketones, POC UA negative negative   Spec Grav, UA 1.020    Blood, UA moderate (A) negative   pH, UA 7.0    Protein Ur, POC negative negative   Urobilinogen, UA 0.2    Nitrite, UA Negative Negative   Leukocytes, UA Negative Negative       Assessment & Plan:  1. Annual physical exam Anticipatory guidance given   2. Need for Tdap vaccination - Tdap vaccine greater than or equal to 7yo IM  3. Depression, unspecified depression type Stable, will continue current regimen  - FLUoxetine (PROZAC) 40 MG capsule; Take 1 capsule (40 mg total) by mouth daily.  Dispense: 90 capsule; Refill: 3  4. Insomnia, unspecified type Stable, will continue current regimen  - traZODone (DESYREL) 50 MG tablet; Take 1 tablet (50 mg total) by mouth at bedtime.  Dispense: 90 tablet; Refill: 3  5. Hyperlipidemia, unspecified hyperlipidemia type Await labs for further guidance  - Lipid panel - CBC with Differential/Platelet - POCT urinalysis dipstick - Urinalysis, microscopic only - Comprehensive metabolic panel - niacin [id number] MG CR capsule; Take 1 capsule (250 mg total) by mouth at bedtime.  Dispense: 90 capsule; Refill: 3  6. Asymptomatic microscopic hematuria Await urin culture results, will treat if infectious process, however if etiology is unclear will refer to urology - Urine culture

## 2016-01-27 NOTE — Patient Instructions (Addendum)
Bring in most recent mammogram records next visit    IF you received an x-ray today, you will receive an invoice from [facility]. Please contact [facility] at [phone] with questions or concerns regarding your invoice.   IF you received labwork today, you will receive an invoice from [employer]. Please contact [facility] at [phone] with questions or concerns regarding your invoice.   Our billing staff will not be able to assist you with questions regarding bills from these companies.  You will be contacted with the lab results as soon as they are available. The fastest way to get your results is to activate your My Chart account. Instructions are located on the last page of this paperwork. If you have not heard from [place] regarding the results in 2 weeks, please contact this office.    Keeping You Healthy  Get These Tests 1. Blood Pressure- Have your blood pressure checked once a year by your health care provider.  Normal blood pressure is 120/80. 2. Weight- Have your body mass index (BMI) calculated to screen for obesity.  BMI is measure of body fat based on height and weight.  You can also calculate your own BMI at GravelBags.it. 3. Cholesterol- Have your cholesterol checked every 5 years starting at age 57 then yearly starting at age 82. 39. Chlamydia, HIV, and other sexually transmitted diseases- Get screened every year until age 63, then within three months of each new sexual provider. 5. Pap Test - Every 1-5 years; discuss with your health care provider. 6. Mammogram- Every 1-2 years starting at age 85--46  Take these medicines  Calcium with Vitamin D-Your body needs 1200 mg of Calcium each day and [phone] IU of Vitamin D daily.  Your body can only absorb 500 mg of Calcium at a time so Calcium must be taken in 2 or 3 divided doses throughout the day.  Multivitamin with folic acid- Once daily if it is possible for you to  become pregnant.  Get these Immunizations  Gardasil-Series of three doses; prevents HPV related illness such as genital warts and cervical cancer.  Menactra-Single dose; prevents meningitis.  Tetanus shot- Every 10 years.  Flu shot-Every year.  Take these steps 1. Do not smoke-Your healthcare provider can help you quit.  For tips on how to quit go to www.smokefree.gov or call 1-800 QUITNOW. 2. Be physically active- Exercise 5 days a week for at least 30 minutes.  If you are not already physically active, start slow and gradually work up to 30 minutes of moderate physical activity.  Examples of moderate activity include walking briskly, dancing, swimming, bicycling, etc. 3. Breast Cancer- A self breast exam every month is important for early detection of breast cancer.  For more information and instruction on self breast exams, ask your healthcare provider or https://www.patel.info/. 4. Eat a healthy diet- Eat a variety of healthy foods such as fruits, vegetables, whole grains, low fat milk, low fat cheeses, yogurt, lean meats, poultry and fish, beans, nuts, tofu, etc.  For more information go to www. Thenutritionsource.org 5. Drink alcohol in moderation- Limit alcohol intake to one drink or less per day. Never drink and drive. 6. Depression- Your emotional health is as important as your physical health.  If you're feeling down or losing interest in things you normally enjoy please talk to your healthcare provider about being screened for depression. 7. Dental visit- Brush and floss your teeth twice daily; visit your dentist twice a year. 8. Eye doctor-  Get an eye exam at least every 2 years. 9. Helmet use- Always wear a helmet when riding a bicycle, motorcycle, rollerblading or skateboarding. 90. Safe sex- If you may be exposed to sexually transmitted infections, use a condom. 11. Seat belts- Seat belts can save your live; always wear one. 12. Smoke/Carbon Monoxide  detectors- These detectors need to be installed on the appropriate level of your home. Replace batteries at least once a year. 13. Skin cancer- When out in the sun please cover up and use sunscreen 15 SPF or higher. 14. Violence- If anyone is threatening or hurting you, please tell your healthcare provider.

## 2016-01-28 LAB — URINALYSIS, MICROSCOPIC ONLY
BACTERIA UA: NONE SEEN [HPF]
CASTS: NONE SEEN [LPF]
Crystals: NONE SEEN [HPF]
WBC, UA: NONE SEEN WBC/HPF (ref <=5)
YEAST: NONE SEEN [HPF]

## 2016-01-28 LAB — URINE CULTURE

## 2016-01-29 MED ORDER — AMOXICILLIN 875 MG PO TABS: 875.0000 mg | ORAL_TABLET | Freq: Two times a day (BID) | ORAL | 0 refills | Status: AC

## 2016-01-29 NOTE — Addendum Note (Signed)
Addended by: Fara Chute on: 01/29/2016 11:04 AM   Modules accepted: Orders

## 2016-02-10 ENCOUNTER — Other Ambulatory Visit (INDEPENDENT_AMBULATORY_CARE_PROVIDER_SITE_OTHER): Payer: 59 | Admitting: Physician Assistant

## 2016-02-10 DIAGNOSIS — R3121 Asymptomatic microscopic hematuria: Secondary | ICD-10-CM

## 2016-02-11 LAB — URINALYSIS, MICROSCOPIC ONLY
BACTERIA UA: NONE SEEN [HPF]
CASTS: NONE SEEN [LPF]
CRYSTALS: NONE SEEN [HPF]
SQUAMOUS EPITHELIAL / LPF: NONE SEEN [HPF] (ref <=5)
WBC, UA: NONE SEEN WBC/HPF (ref <=5)
YEAST: NONE SEEN [HPF]

## 2016-02-16 ENCOUNTER — Encounter: Payer: Self-pay | Admitting: Physician Assistant

## 2016-02-16 NOTE — Telephone Encounter (Signed)
[name], didn't see your remarks on lab. Please read and advise pt.

## 2016-02-18 NOTE — Telephone Encounter (Signed)
Patient notified in MyChart.

## 2016-03-30 ENCOUNTER — Encounter: Payer: Self-pay | Admitting: Physician Assistant

## 2016-03-30 MED ORDER — NEOMYCIN-POLYMYXIN-HC 3.5-10000-1 OT SOLN: 4.0000 [drp] | Freq: Four times a day (QID) | OTIC | 0 refills | Status: AC

## 2016-06-30 NOTE — Progress Notes (Signed)
Patient provided urine specimen. 

## 2016-07-13 ENCOUNTER — Other Ambulatory Visit: Payer: Self-pay | Admitting: Obstetrics & Gynecology

## 2016-07-13 DIAGNOSIS — Z1231 Encounter for screening mammogram for malignant neoplasm of breast: Secondary | ICD-10-CM

## 2016-07-19 ENCOUNTER — Encounter: Payer: 59 | Admitting: Obstetrics & Gynecology

## 2016-07-26 ENCOUNTER — Encounter: Payer: 59 | Admitting: Obstetrics & Gynecology

## 2016-07-30 ENCOUNTER — Ambulatory Visit
Admission: RE | Admit: 2016-07-30 | Discharge: 2016-07-30 | Disposition: A | Discharge status: Home / Self Care | Payer: 59 | Source: Ambulatory Visit | Attending: Obstetrics & Gynecology | Admitting: Obstetrics & Gynecology

## 2016-07-30 ENCOUNTER — Encounter: Payer: Self-pay | Admitting: Obstetrics & Gynecology

## 2016-07-30 ENCOUNTER — Ambulatory Visit (INDEPENDENT_AMBULATORY_CARE_PROVIDER_SITE_OTHER): Payer: 59 | Admitting: Obstetrics & Gynecology

## 2016-07-30 VITALS — BP 122/78 | Ht 67.5 in | Wt 178.0 lb

## 2016-07-30 DIAGNOSIS — E663 Overweight: Secondary | ICD-10-CM

## 2016-07-30 DIAGNOSIS — Z1151 Encounter for screening for human papillomavirus (HPV): Secondary | ICD-10-CM | POA: Diagnosis not present

## 2016-07-30 DIAGNOSIS — Z3041 Encounter for surveillance of contraceptive pills: Secondary | ICD-10-CM

## 2016-07-30 DIAGNOSIS — Z01419 Encounter for gynecological examination (general) (routine) without abnormal findings: Secondary | ICD-10-CM

## 2016-07-30 DIAGNOSIS — Z8742 Personal history of other diseases of the female genital tract: Secondary | ICD-10-CM | POA: Diagnosis not present

## 2016-07-30 DIAGNOSIS — Z1231 Encounter for screening mammogram for malignant neoplasm of breast: Secondary | ICD-10-CM

## 2016-07-30 MED ORDER — NORETHINDRONE ACET-ETHINYL EST 1-20 MG-MCG PO TABS: 1.0000 | ORAL_TABLET | Freq: Every day | ORAL | 4 refills | Status: DC

## 2016-07-30 NOTE — Progress Notes (Signed)
Laurie Bond Dec 21, 1970 [id number]   History:    46 y.o.  for annual gyn exam, established patient, G2P2 married.  S/P BT/S.  Children son 47 and daughter 19 yo.  Well on Gildess 1/20 continuous use for Dysmeno control.  No pelvic pain.  No abnormal bleeding.  Successfully loosing wt on low carb diet/physical activity.  Breasts wnl.  Past medical history,surgical history, family history and social history were all reviewed and documented in the EPIC chart.  Gynecologic History No LMP recorded. Patient is not currently having periods (Reason: Oral contraceptives). Contraception: tubal ligation Last Pap: 2016. Results were: normal Last mammogram: 2015. Results were: normal  Obstetric History OB History  Gravida Para Term Preterm AB Living  2 2       2   SAB TAB Ectopic Multiple Live Births               # Outcome Date GA Lbr Len/2nd Weight Sex Delivery Anes PTL Lv  2 Para           1 Para                ROS: A ROS was performed and pertinent positives and negatives are included in the history.  GENERAL: No fevers or chills. HEENT: No change in vision, no earache, sore throat or sinus congestion. NECK: No pain or stiffness. CARDIOVASCULAR: No chest pain or pressure. No palpitations. PULMONARY: No shortness of breath, cough or wheeze. GASTROINTESTINAL: No abdominal pain, nausea, vomiting or diarrhea, melena or bright red blood per rectum. GENITOURINARY: No urinary frequency, urgency, hesitancy or dysuria. MUSCULOSKELETAL: No joint or muscle pain, no back pain, no recent trauma. DERMATOLOGIC: No rash, no itching, no lesions. ENDOCRINE: No polyuria, polydipsia, no heat or cold intolerance. No recent change in weight. HEMATOLOGICAL: No anemia or easy bruising or bleeding. NEUROLOGIC: No headache, seizures, numbness, tingling or weakness. PSYCHIATRIC: No depression, no loss of interest in normal activity or change in sleep pattern.     Exam:   BP 122/78   Ht 5' 7.5" (1.715 m)   Wt  178 lb (80.7 kg)   BMI 27.47 kg/m   Body mass index is 27.47 kg/m.  General appearance : Well developed well nourished female. No acute distress HEENT: Eyes: no retinal hemorrhage or exudates,  Neck supple, trachea midline, no carotid bruits, no thyroidmegaly Lungs: Clear to auscultation, no rhonchi or wheezes, or rib retractions  Heart: Regular rate and rhythm, no murmurs or gallops Breast:Examined in sitting and supine position were symmetrical in appearance, no palpable masses or tenderness,  no skin retraction, no nipple inversion, no nipple discharge, no skin discoloration, no axillary or supraclavicular lymphadenopathy Abdomen: no palpable masses or tenderness, no rebound or guarding Extremities: no edema or skin discoloration or tenderness  Pelvic:  Bartholin, Urethra, Skene Glands: Within normal limits             Vagina: No gross lesions or discharge  Cervix: No gross lesions or discharge.  Pap done.  Uterus  AV, normal size, shape and consistency, non-tender and mobile  Adnexa  Without masses or tenderness  Anus and perineum  normal    Assessment/Plan:  46 y.o. female for annual exam.  1. Encounter for routine gynecological examination with Papanicolaou smear of cervix Pap/HPV HR pending.  Screening mammo today.  2. Encounter for surveillance of contraceptive pills Gildess 1/20 continuous use represcribed for cycle control/Dysmeno.  S/P BT/S  3. H/O dysmenorrhea No menses on continuous BCPs  4. Overweight (BMI 25.0-29.9) Low carb diet and Physical activity reviewed.  Successfully loosing weight.  Counseling on above Dx/management >50% x 10 min.   Princess Bruins MD, 10:50 AM 07/30/2016

## 2016-07-30 NOTE — Patient Instructions (Signed)
Normal Aex/Gyn exam.  Pap/HPV HR pending.  Will do Screening Mammo today.  BCP represcribed continuous use to control Dysmenorrhea.  Wt loss low carb diet/physical activity reviewed.

## 2016-07-30 NOTE — Addendum Note (Signed)
Addended by: Thurnell Garbe A on: 07/30/2016 11:16 AM   Modules accepted: Orders

## 2016-08-03 ENCOUNTER — Encounter: Payer: Self-pay | Admitting: Obstetrics & Gynecology

## 2016-08-03 LAB — PAP, TP IMAGING W/ HPV RNA, RFLX HPV TYPE 16,18/45: HPV mRNA, High Risk: NOT DETECTED

## 2017-01-30 ENCOUNTER — Other Ambulatory Visit: Payer: Self-pay | Admitting: Physician Assistant

## 2017-01-30 DIAGNOSIS — G4700 Insomnia, unspecified: Secondary | ICD-10-CM

## 2017-01-30 DIAGNOSIS — F32A Depression, unspecified: Secondary | ICD-10-CM

## 2017-01-30 DIAGNOSIS — F329 Major depressive disorder, single episode, unspecified: Secondary | ICD-10-CM

## 2017-02-04 ENCOUNTER — Encounter: Payer: Self-pay | Admitting: Physician Assistant

## 2017-02-04 ENCOUNTER — Ambulatory Visit (INDEPENDENT_AMBULATORY_CARE_PROVIDER_SITE_OTHER): Payer: 59 | Admitting: Physician Assistant

## 2017-02-04 VITALS — BP 118/80 | HR 96 | Temp 97.9°F | Resp 18 | Ht 67.5 in | Wt 159.4 lb

## 2017-02-04 DIAGNOSIS — Z1329 Encounter for screening for other suspected endocrine disorder: Secondary | ICD-10-CM | POA: Diagnosis not present

## 2017-02-04 DIAGNOSIS — G4700 Insomnia, unspecified: Secondary | ICD-10-CM

## 2017-02-04 DIAGNOSIS — Z1389 Encounter for screening for other disorder: Secondary | ICD-10-CM

## 2017-02-04 DIAGNOSIS — E785 Hyperlipidemia, unspecified: Secondary | ICD-10-CM | POA: Diagnosis not present

## 2017-02-04 DIAGNOSIS — F329 Major depressive disorder, single episode, unspecified: Secondary | ICD-10-CM

## 2017-02-04 DIAGNOSIS — Z0000 Encounter for general adult medical examination without abnormal findings: Secondary | ICD-10-CM | POA: Diagnosis not present

## 2017-02-04 DIAGNOSIS — Z130 Encounter for screening for diseases of the blood and blood-forming organs and certain disorders involving the immune mechanism: Secondary | ICD-10-CM

## 2017-02-04 DIAGNOSIS — F32A Depression, unspecified: Secondary | ICD-10-CM

## 2017-02-04 DIAGNOSIS — Z23 Encounter for immunization: Secondary | ICD-10-CM | POA: Diagnosis not present

## 2017-02-04 MED ORDER — TRAZODONE HCL 50 MG PO TABS: 50.0000 mg | ORAL_TABLET | Freq: Every day | ORAL | 3 refills | Status: DC

## 2017-02-04 MED ORDER — FLUOXETINE HCL 40 MG PO CAPS: 40.0000 mg | ORAL_CAPSULE | Freq: Every day | ORAL | 3 refills | Status: DC

## 2017-02-04 NOTE — Patient Instructions (Addendum)
Keep up the great work!    IF you received an x-ray today, you will receive an invoice from [facility]. Please contact [facility] at [phone] with questions or concerns regarding your invoice.   IF you received labwork today, you will receive an invoice from [facility]. Please contact [facility] at [phone] with questions or concerns regarding your invoice.   Our billing staff will not be able to assist you with questions regarding bills from these companies.  You will be contacted with the lab results as soon as they are available. The fastest way to get your results is to activate your My Chart account. Instructions are located on the last page of this paperwork. If you have not heard from us regarding the results in 2 weeks, please contact this office.     Preventive Care 40-64 Years, Female Preventive care refers to lifestyle choices and visits with your health care provider that can promote health and wellness. What does preventive care include?  A yearly physical exam. This is also called an annual well check.  Dental exams once or twice a year.  Routine eye exams. Ask your health care provider how often you should have your eyes checked.  Personal lifestyle choices, including: ? Daily care of your teeth and gums. ? Regular physical activity. ? Eating a healthy diet. ? Avoiding tobacco and drug use. ? Limiting alcohol use. ? Practicing safe sex. ? Taking low-dose aspirin daily starting at age 50. ? Taking vitamin and mineral supplements as recommended by your health care provider. What happens during an annual well check? The services and screenings done by your health care provider during your annual well check will depend on your age, overall health, lifestyle risk factors, and family history of disease. Counseling Your health care provider may ask you questions about your:  Alcohol use.  Tobacco use.  Drug use.  Emotional  well-being.  Home and relationship well-being.  Sexual activity.  Eating habits.  Work and work environment.  Method of birth control.  Menstrual cycle.  Pregnancy history.  Screening You may have the following tests or measurements:  Height, weight, and BMI.  Blood pressure.  Lipid and cholesterol levels. These may be checked every 5 years, or more frequently if you are over 50 years old.  Skin check.  Lung cancer screening. You may have this screening every year starting at age 55 if you have a 30-pack-year history of smoking and currently smoke or have quit within the past 15 years.  Fecal occult blood test (FOBT) of the stool. You may have this test every year starting at age 50.  Flexible sigmoidoscopy or colonoscopy. You may have a sigmoidoscopy every 5 years or a colonoscopy every 10 years starting at age 50.  Hepatitis C blood test.  Hepatitis B blood test.  Sexually transmitted disease (STD) testing.  Diabetes screening. This is done by checking your blood sugar (glucose) after you have not eaten for a while (fasting). You may have this done every 1-3 years.  Mammogram. This may be done every 1-2 years. Talk to your health care provider about when you should start having regular mammograms. This may depend on whether you have a family history of breast cancer.  BRCA-related cancer screening. This may be done if you have a family history of breast, ovarian, tubal, or peritoneal cancers.  Pelvic exam and Pap test. This may be done every 3 years starting at age 21. Starting at age 30, this may be done   every 5 years if you have a Pap test in combination with an HPV test.  Bone density scan. This is done to screen for osteoporosis. You may have this scan if you are at high risk for osteoporosis.  Discuss your test results, treatment options, and if necessary, the need for more tests with your health care provider. Vaccines Your health care provider may recommend  certain vaccines, such as:  Influenza vaccine. This is recommended every year.  Tetanus, diphtheria, and acellular pertussis (Tdap, Td) vaccine. You may need a Td booster every 10 years.  Varicella vaccine. You may need this if you have not been vaccinated.  Zoster vaccine. You may need this after age 60.  Measles, mumps, and rubella (MMR) vaccine. You may need at least one dose of MMR if you were born in 1957 or later. You may also need a second dose.  Pneumococcal 13-valent conjugate (PCV13) vaccine. You may need this if you have certain conditions and were not previously vaccinated.  Pneumococcal polysaccharide (PPSV23) vaccine. You may need one or two doses if you smoke cigarettes or if you have certain conditions.  Meningococcal vaccine. You may need this if you have certain conditions.  Hepatitis A vaccine. You may need this if you have certain conditions or if you travel or work in places where you may be exposed to hepatitis A.  Hepatitis B vaccine. You may need this if you have certain conditions or if you travel or work in places where you may be exposed to hepatitis B.  Haemophilus influenzae type b (Hib) vaccine. You may need this if you have certain conditions.  Talk to your health care provider about which screenings and vaccines you need and how often you need them. This information is not intended to replace advice given to you by your health care provider. Make sure you discuss any questions you have with your health care provider. Document Released: 05/02/2015 Document Revised: 12/24/2015 Document Reviewed: 02/04/2015 Elsevier Interactive Patient Education  2017 [employer]  

## 2017-02-04 NOTE — Progress Notes (Signed)
Patient ID: Laurie Bond, female    DOB: 21-Oct-1970, 46 y.o.   MRN: [id number]  PCP: Harrison Mons, PA-C  Chief Complaint  Patient presents with  . Annual Exam    No PAP needed.  . Medication Refill    Prozac 40 MG, Trazdone 50 MG    Subjective:   Presents for [employer].  Needs refills of her regular medications, which are working well for her and without adverse effects. Phentermine. Tried in July, but didn't like it. Started going to the gym instead. Treadmill, elliptical, some weight training. Goal is to lose 10-15 additional pounds, then maintain that weight with continued healthy lifestyle.  Requests dermatology evaluation due to her mother's history of skin cancer.  Cervical Cancer Screening: with GYN (Dr. Dellis Filbert) Breast Cancer Screening: with Gyn Colorectal Cancer Screening: not yet a candidate Bone Density Testing: not yet a candidate HIV Screening: completed. Negative result in 2016 STI Screening: very low risk Seasonal Influenza Vaccination: today Td/Tdap Vaccination: 01/2016 Pneumococcal Vaccination: not yet a candidate Zoster Vaccination: not yet a candidate Frequency of dental visits: Q6 months Frequency of eye exam: annually  Patient Active Problem List   Diagnosis Date Noted  . Insomnia 01/27/2016  . Hyperlipidemia 03/07/2012  . Depression 03/07/2012  . Immune to varicella 03/07/2006    Past Medical History:  Diagnosis Date  . Cholecystitis with cholelithiasis 03/15/2015  . Depression   . Dyspepsia and disorder of function of stomach 01/16/2014   Mild reflux symptoms, effectively reduced with Prilosec.   Marland Kitchen Gestational diabetes 2010   2nd of 2 pregnancies  . Hyperlipidemia   . Obesity   . Pre-eclampsia 2004  . Pre-eclampsia 2004   1st of 2 pregnancies     Prior to Admission medications   Medication Sig Start Date End Date Taking? Authorizing Provider  FLUoxetine (PROZAC) 40 MG capsule Take 1 capsule (40 mg total) by mouth  daily. 01/27/16  Yes Isabel Ardila, Domingo Mend, PA-C  norethindrone-ethinyl estradiol (GILDESS 1/20) 1-20 MG-MCG tablet Take 1 tablet by mouth daily. Continuous use because of Dysmenorrhea 07/30/16  Yes Princess Bruins, MD  traZODone (DESYREL) 50 MG tablet Take 1 tablet (50 mg total) by mouth at bedtime. 01/27/16  Yes Hodge Stachnik, PA-C    Allergies  Allergen Reactions  . Pravastatin Other (See Comments)    Severe myalgias    Past Surgical History:  Procedure Laterality Date  . LAPAROSCOPIC CHOLECYSTECTOMY    . TUBAL LIGATION    . WISDOM TOOTH EXTRACTION      Family History  Problem Relation Age of Onset  . Cancer Mother        skin cancer  . Hypertension Mother   . Deep vein thrombosis Father   . Cancer Father        prostate  . Hypertension Father   . Alcohol abuse Sister     Social History   Social History  . Marital status: Married    Spouse name: Shanon Brow  . Number of children: 2  . Years of education: 14   Occupational History  . RN     [employer]   Social History Main Topics  . Smoking status: Never Smoker  . Smokeless tobacco: Never Used  . Alcohol use 0.0 - 0.5 oz/week  . Drug use: No  . Sexual activity: Yes    Partners: Male    Birth control/ protection: Pill   Other Topics Concern  . None   Social History Narrative  Lives with husband and 2 children.       Review of Systems  Constitutional: Negative.   HENT: Negative.   Eyes: Negative.   Respiratory: Negative.   Cardiovascular: Negative.   Gastrointestinal: Negative.   Endocrine: Negative.   Genitourinary: Negative.   Musculoskeletal: Negative.   Skin: Negative.   Allergic/Immunologic: Negative.   Neurological: Negative.   Hematological: Negative.   Psychiatric/Behavioral: Negative.         Objective:  Physical Exam  Constitutional: She is oriented to person, place, and time. Vital signs are normal. She appears well-developed and well-nourished. She is active  and cooperative. No distress.  BP 118/80 (BP Location: Right Arm, Patient Position: Sitting, Cuff Size: Normal)   Pulse 96   Temp 97.9 F (36.6 C) (Oral)   Resp 18   Ht 5' 7.5" (1.715 m)   Wt 159 lb 6.4 oz (72.3 kg)   SpO2 98%   BMI 24.60 kg/m    HENT:  Head: Normocephalic and atraumatic.  Right Ear: Hearing, tympanic membrane, external ear and ear canal normal. No foreign bodies.  Left Ear: Hearing, tympanic membrane, external ear and ear canal normal. No foreign bodies.  Nose: Nose normal.  Mouth/Throat: Uvula is midline, oropharynx is clear and moist and mucous membranes are normal. No oral lesions. Normal dentition. No dental abscesses or uvula swelling. No oropharyngeal exudate.  Eyes: Pupils are equal, round, and reactive to light. Conjunctivae, EOM and lids are normal. Right eye exhibits no discharge. Left eye exhibits no discharge. No scleral icterus.  Fundoscopic exam:      The right eye shows no arteriolar narrowing, no AV nicking, no exudate, no hemorrhage and no papilledema. The right eye shows red reflex.       The left eye shows no arteriolar narrowing, no AV nicking, no exudate, no hemorrhage and no papilledema. The left eye shows red reflex.  Neck: Trachea normal, normal range of motion and full passive range of motion without pain. Neck supple. No spinous process tenderness and no muscular tenderness present. No thyroid mass and no thyromegaly present.  Cardiovascular: Normal rate, regular rhythm, normal heart sounds, intact distal pulses and normal pulses.   Pulmonary/Chest: Effort normal and breath sounds normal.  Musculoskeletal: She exhibits no edema or tenderness.       Cervical back: Normal.       Thoracic back: Normal.       Lumbar back: Normal.  Lymphadenopathy:       Head (right side): No tonsillar, no preauricular, no posterior auricular and no occipital adenopathy present.       Head (left side): No tonsillar, no preauricular, no posterior auricular and no  occipital adenopathy present.    She has no cervical adenopathy.       Right: No supraclavicular adenopathy present.       Left: No supraclavicular adenopathy present.  Neurological: She is alert and oriented to person, place, and time. She has normal strength and normal reflexes. No cranial nerve deficit. She exhibits normal muscle tone. Coordination and gait normal.  Skin: Skin is warm, dry and intact. No rash noted. She is not diaphoretic. No cyanosis or erythema. Nails show no clubbing.  Psychiatric: She has a normal mood and affect. Her speech is normal and behavior is normal. Judgment and thought content normal.       Wt Readings from Last 3 Encounters:  02/04/17 159 lb 6.4 oz (72.3 kg)  07/30/16 178 lb (80.7 kg)  01/27/16 198 lb 3.2  oz (89.9 kg)       Assessment & Plan:   Problem List Items Addressed This Visit    Hyperlipidemia (Chronic)    Anticipate improvement, as she has continued to make healthy lifestyle changes and is consistent with statin. Await labs. Adjust regimen as indicated by results.      Relevant Orders   Comprehensive metabolic panel (Completed)   Lipid panel (Completed)   Depression (Chronic)    Well controlled. Continue fluoxetine and trazodone.      Relevant Medications   traZODone (DESYREL) 50 MG tablet   FLUoxetine (PROZAC) 40 MG capsule   Insomnia    Controlled. Continue trazodone.      Relevant Medications   traZODone (DESYREL) 50 MG tablet    Other Visit Diagnoses    Annual physical exam    -  Primary   Age appropriate health guidance provided.   Need for influenza vaccination       Relevant Orders   Flu Vaccine QUAD 36+ mos IM (Completed)   Screening for deficiency anemia       Relevant Orders   CBC with Differential/Platelet (Completed)   Screening for thyroid disorder       Relevant Orders   TSH (Completed)   Screening for blood or protein in urine       Relevant Orders   Urinalysis, dipstick only (Completed)        Return in about 1 year (around 02/04/2018) for wellness visit.   Fara Chute, PA-C [facility]

## 2017-02-05 LAB — CBC WITH DIFFERENTIAL/PLATELET
Basophils Absolute: 0 10*3/uL (ref 0.0–0.2)
Basos: 1 %
EOS (ABSOLUTE): 0.1 10*3/uL (ref 0.0–0.4)
Eos: 2 %
HEMATOCRIT: 39.7 % (ref 34.0–46.6)
HEMOGLOBIN: 13.4 g/dL (ref 11.1–15.9)
Immature Grans (Abs): 0 10*3/uL (ref 0.0–0.1)
Immature Granulocytes: 0 %
LYMPHS ABS: 1.4 10*3/uL (ref 0.7–3.1)
Lymphs: 23 %
MCH: 29.3 pg (ref 26.6–33.0)
MCHC: 33.8 g/dL (ref 31.5–35.7)
MCV: 87 fL (ref 79–97)
MONOCYTES: 5 %
MONOS ABS: 0.3 10*3/uL (ref 0.1–0.9)
NEUTROS ABS: 4.3 10*3/uL (ref 1.4–7.0)
Neutrophils: 69 %
Platelets: 234 10*3/uL (ref 150–379)
RBC: 4.57 x10E6/uL (ref 3.77–5.28)
RDW: 13.4 % (ref 12.3–15.4)
WBC: 6.1 10*3/uL (ref 3.4–10.8)

## 2017-02-05 LAB — URINALYSIS, DIPSTICK ONLY
BILIRUBIN UA: NEGATIVE
Glucose, UA: NEGATIVE
KETONES UA: NEGATIVE
LEUKOCYTES UA: NEGATIVE
NITRITE UA: NEGATIVE
Protein, UA: NEGATIVE
SPEC GRAV UA: 1.022 (ref 1.005–1.030)
UUROB: 0.2 mg/dL (ref 0.2–1.0)
pH, UA: 5.5 (ref 5.0–7.5)

## 2017-02-05 LAB — COMPREHENSIVE METABOLIC PANEL
ALBUMIN: 4.6 g/dL (ref 3.5–5.5)
ALK PHOS: 67 IU/L (ref 39–117)
ALT: 12 IU/L (ref 0–32)
AST: 15 IU/L (ref 0–40)
Albumin/Globulin Ratio: 1.7 (ref 1.2–2.2)
BILIRUBIN TOTAL: 0.4 mg/dL (ref 0.0–1.2)
BUN / CREAT RATIO: 16 (ref 9–23)
BUN: 12 mg/dL (ref 6–24)
CHLORIDE: 101 mmol/L (ref 96–106)
CO2: 23 mmol/L (ref 20–29)
Calcium: 9.3 mg/dL (ref 8.7–10.2)
Creatinine, Ser: 0.77 mg/dL (ref 0.57–1.00)
GFR calc Af Amer: 106 mL/min/{1.73_m2} (ref >59)
GFR calc non Af Amer: 92 mL/min/{1.73_m2} (ref >59)
Globulin, Total: 2.7 g/dL (ref 1.5–4.5)
Glucose: 75 mg/dL (ref 65–99)
Potassium: 4.7 mmol/L (ref 3.5–5.2)
SODIUM: 137 mmol/L (ref 134–144)
Total Protein: 7.3 g/dL (ref 6.0–8.5)

## 2017-02-05 LAB — LIPID PANEL
CHOLESTEROL TOTAL: 235 mg/dL — AB (ref 100–199)
Chol/HDL Ratio: 4.3 ratio (ref 0.0–4.4)
HDL: 55 mg/dL (ref >39)
LDL Calculated: 140 mg/dL — ABNORMAL HIGH (ref 0–99)
TRIGLYCERIDES: 202 mg/dL — AB (ref 0–149)
VLDL CHOLESTEROL CAL: 40 mg/dL (ref 5–40)

## 2017-02-05 LAB — TSH: TSH: 0.856 u[IU]/mL (ref 0.450–4.500)

## 2017-02-07 NOTE — Assessment & Plan Note (Signed)
Controlled. Continue trazodone.

## 2017-02-07 NOTE — Assessment & Plan Note (Signed)
Well controlled. Continue fluoxetine and trazodone.

## 2017-02-07 NOTE — Assessment & Plan Note (Signed)
Anticipate improvement, as she has continued to make healthy lifestyle changes and is consistent with statin. Await labs. Adjust regimen as indicated by results.

## 2017-05-02 ENCOUNTER — Encounter: Payer: Self-pay | Admitting: Physician Assistant

## 2017-05-02 DIAGNOSIS — Z1283 Encounter for screening for malignant neoplasm of skin: Secondary | ICD-10-CM

## 2017-06-02 ENCOUNTER — Other Ambulatory Visit: Payer: Self-pay

## 2017-06-02 DIAGNOSIS — D485 Neoplasm of uncertain behavior of skin: Secondary | ICD-10-CM | POA: Diagnosis not present

## 2017-06-13 ENCOUNTER — Other Ambulatory Visit: Payer: Self-pay | Admitting: Obstetrics & Gynecology

## 2017-06-13 DIAGNOSIS — Z1231 Encounter for screening mammogram for malignant neoplasm of breast: Secondary | ICD-10-CM

## 2017-07-21 ENCOUNTER — Encounter: Payer: Self-pay | Admitting: Physician Assistant

## 2017-08-03 ENCOUNTER — Other Ambulatory Visit: Payer: Self-pay | Admitting: Obstetrics & Gynecology

## 2017-08-03 NOTE — Telephone Encounter (Signed)
annual scheduled on 09/09/17

## 2017-08-16 ENCOUNTER — Ambulatory Visit
Admission: RE | Admit: 2017-08-16 | Discharge: 2017-08-16 | Disposition: A | Discharge status: Home / Self Care | Payer: 59 | Source: Ambulatory Visit | Attending: Obstetrics & Gynecology | Admitting: Obstetrics & Gynecology

## 2017-08-16 DIAGNOSIS — Z1231 Encounter for screening mammogram for malignant neoplasm of breast: Secondary | ICD-10-CM

## 2017-08-26 ENCOUNTER — Encounter: Payer: 59 | Admitting: Obstetrics & Gynecology

## 2017-09-09 ENCOUNTER — Encounter: Payer: Self-pay | Admitting: Obstetrics & Gynecology

## 2017-09-09 ENCOUNTER — Ambulatory Visit (INDEPENDENT_AMBULATORY_CARE_PROVIDER_SITE_OTHER): Payer: 59 | Admitting: Obstetrics & Gynecology

## 2017-09-09 VITALS — BP 126/78 | Ht 67.0 in | Wt 166.8 lb

## 2017-09-09 DIAGNOSIS — Z01419 Encounter for gynecological examination (general) (routine) without abnormal findings: Secondary | ICD-10-CM

## 2017-09-09 DIAGNOSIS — Z9851 Tubal ligation status: Secondary | ICD-10-CM | POA: Diagnosis not present

## 2017-09-09 DIAGNOSIS — Z3041 Encounter for surveillance of contraceptive pills: Secondary | ICD-10-CM | POA: Diagnosis not present

## 2017-09-09 MED ORDER — NORETHIN ACE-ETH ESTRAD-FE 1-20 MG-MCG PO TABS: 1.0000 | ORAL_TABLET | Freq: Every day | ORAL | 4 refills | Status: DC

## 2017-09-09 NOTE — Progress Notes (Signed)
Laurie Bond [birth year] [id number]   History:    47 y.o. G2P2L2 Married.  S/P Tubal Ligation.  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  RP:  Established patient presenting for annual gyn exam   HPI: Well on Junel 1/20 for cycle control.  No breakthrough bleeding.  No pelvic pain.  Normal vaginal secretions.  No pain with intercourse.  Urine and bowel movements normal.  Breasts normal.  Body mass index 26.12.  Has lost 12 pounds since last year with a better nutrition plan with lower calorie and carbs combined with regular physical activities including aerobic and weightlifting.  Patient feels that she has a lot more energy since she improved her lifestyle.  Health labs with family physician.  Past medical history,surgical history, family history and social history were all reviewed and documented in the EPIC chart.  Gynecologic History No LMP recorded. (Menstrual status: Oral contraceptives). Contraception: OCP (estrogen/progesterone) Last Pap: 07/2016. Results were: Negative, HR HPV negative Last mammogram: 07/2017. Results were: Negative Bone Density: Never Colonoscopy: Never  Obstetric History OB History  Gravida Para Term Preterm AB Living  2 2       2   SAB TAB Ectopic Multiple Live Births               # Outcome Date GA Lbr Len/2nd Weight Sex Delivery Anes PTL Lv  2 Para           1 Para              ROS: A ROS was performed and pertinent positives and negatives are included in the history.  GENERAL: No fevers or chills. HEENT: No change in vision, no earache, sore throat or sinus congestion. NECK: No pain or stiffness. CARDIOVASCULAR: No chest pain or pressure. No palpitations. PULMONARY: No shortness of breath, cough or wheeze. GASTROINTESTINAL: No abdominal pain, nausea, vomiting or diarrhea, melena or bright red blood per rectum. GENITOURINARY: No urinary frequency, urgency, hesitancy or dysuria. MUSCULOSKELETAL: No joint or muscle pain, no back  pain, no recent trauma. DERMATOLOGIC: No rash, no itching, no lesions. ENDOCRINE: No polyuria, polydipsia, no heat or cold intolerance. No recent change in weight. HEMATOLOGICAL: No anemia or easy bruising or bleeding. NEUROLOGIC: No headache, seizures, numbness, tingling or weakness. PSYCHIATRIC: No depression, no loss of interest in normal activity or change in sleep pattern.     Exam:   BP 126/78   Ht 5\' 7"  (1.702 m)   Wt 166 lb 12.8 oz (75.7 kg)   BMI 26.12 kg/m   Body mass index is 26.12 kg/m.  General appearance : Well developed well nourished female. No acute distress HEENT: Eyes: no retinal hemorrhage or exudates,  Neck supple, trachea midline, no carotid bruits, no thyroidmegaly Lungs: Clear to auscultation, no rhonchi or wheezes, or rib retractions  Heart: Regular rate and rhythm, no murmurs or gallops Breast:Examined in sitting and supine position were symmetrical in appearance, no palpable masses or tenderness,  no skin retraction, no nipple inversion, no nipple discharge, no skin discoloration, no axillary or supraclavicular lymphadenopathy Abdomen: no palpable masses or tenderness, no rebound or guarding Extremities: no edema or skin discoloration or tenderness  Pelvic: Vulva: Normal             Vagina: No gross lesions or discharge  Cervix: No gross lesions or discharge  Uterus  AV, normal size, shape and consistency, non-tender and mobile  Adnexa  Without masses or tenderness  Anus: Normal  Assessment/Plan:  47 y.o. female for annual exam   1. Well female exam with routine gynecological exam Normal gynecologic exam.  Pap tests in April 2018 was negative with negative high risk HPV.  Will repeat at 3 years intervals.  Breast exam normal.  Recent screening mammogram was -April 2019.  Health labs with family physician.  Body mass index improving at 26.12 with 12 pound weight loss since last year has increased her physical activity with aerobic and weightlifting  activities as well as decreased total calories and carbs.  2. S/P tubal ligation  3. Encounter for surveillance of contraceptive pills Junel FE 1/20 represcribed for cycle control.  No contraindication.  Other orders - norethindrone-ethinyl estradiol (JUNEL FE 1/20) 1-20 MG-MCG tablet; Take 1 tablet by mouth daily.  Princess Bruins MD, 10:49 AM 09/09/2017

## 2017-09-09 NOTE — Patient Instructions (Signed)
1. Well female exam with routine gynecological exam Normal gynecologic exam.  Pap tests in April 2018 was negative with negative high risk HPV.  Will repeat at 3 years intervals.  Breast exam normal.  Recent screening mammogram was -April 2019.  Health labs with family physician.  Body mass index improving at 26.12 with 12 pound weight loss since last year has increased her physical activity with aerobic and weightlifting activities as well as decreased total calories and carbs.  2. S/P tubal ligation  3. Encounter for surveillance of contraceptive pills Junel FE 1/20 represcribed for cycle control.  No contraindication.  Other orders - norethindrone-ethinyl estradiol (JUNEL FE 1/20) 1-20 MG-MCG tablet; Take 1 tablet by mouth daily.  Laurie Bond, it was a pleasure seeing you today!

## 2018-01-26 ENCOUNTER — Other Ambulatory Visit: Payer: Self-pay

## 2018-01-26 ENCOUNTER — Ambulatory Visit: Payer: 59 | Admitting: Family Medicine

## 2018-01-26 ENCOUNTER — Encounter: Payer: Self-pay | Admitting: Family Medicine

## 2018-01-26 VITALS — BP 132/83 | HR 77 | Temp 98.4°F | Resp 18 | Ht 67.0 in | Wt 175.4 lb

## 2018-01-26 DIAGNOSIS — R1012 Left upper quadrant pain: Secondary | ICD-10-CM | POA: Diagnosis not present

## 2018-01-26 DIAGNOSIS — R1032 Left lower quadrant pain: Secondary | ICD-10-CM

## 2018-01-26 DIAGNOSIS — G4700 Insomnia, unspecified: Secondary | ICD-10-CM | POA: Diagnosis not present

## 2018-01-26 DIAGNOSIS — R1084 Generalized abdominal pain: Secondary | ICD-10-CM | POA: Diagnosis not present

## 2018-01-26 DIAGNOSIS — F32A Depression, unspecified: Secondary | ICD-10-CM

## 2018-01-26 DIAGNOSIS — F329 Major depressive disorder, single episode, unspecified: Secondary | ICD-10-CM

## 2018-01-26 LAB — POC MICROSCOPIC URINALYSIS (UMFC): MUCUS RE: ABSENT

## 2018-01-26 LAB — POCT CBC
GRANULOCYTE PERCENT: 71.5 % (ref 37–80)
HEMATOCRIT: 40.2 % (ref 37.7–47.9)
HEMOGLOBIN: 13.4 g/dL (ref 12.2–16.2)
Lymph, poc: 1.9 (ref 0.6–3.4)
MCH, POC: 29.7 pg (ref 27–31.2)
MCHC: 33.4 g/dL (ref 31.8–35.4)
MCV: 89.1 fL (ref 80–97)
MID (cbc): 0.4 (ref 0–0.9)
MPV: 6.2 fL (ref 0–99.8)
POC GRANULOCYTE: 5.6 (ref 2–6.9)
POC LYMPH PERCENT: 23.5 %L (ref 10–50)
POC MID %: 5 %M (ref 0–12)
Platelet Count, POC: 334 10*3/uL (ref 142–424)
RBC: 4.51 M/uL (ref 4.04–5.48)
RDW, POC: 12.6 %
WBC: 7.9 10*3/uL (ref 4.6–10.2)

## 2018-01-26 LAB — POCT URINALYSIS DIP (MANUAL ENTRY)
BILIRUBIN UA: NEGATIVE mg/dL
Bilirubin, UA: NEGATIVE
Glucose, UA: NEGATIVE mg/dL
Leukocytes, UA: NEGATIVE
Nitrite, UA: NEGATIVE
PH UA: 6.5 (ref 5.0–8.0)
PROTEIN UA: NEGATIVE mg/dL
SPEC GRAV UA: 1.01 (ref 1.010–1.025)
Urobilinogen, UA: 0.2 E.U./dL

## 2018-01-26 MED ORDER — TRAZODONE HCL 50 MG PO TABS: 50.0000 mg | ORAL_TABLET | Freq: Every day | ORAL | 3 refills | Status: DC

## 2018-01-26 MED ORDER — FLUOXETINE HCL 40 MG PO CAPS: 40.0000 mg | ORAL_CAPSULE | Freq: Every day | ORAL | 3 refills | Status: DC

## 2018-01-26 NOTE — Progress Notes (Signed)
Subjective:  By signing my name below, I, Moises Blood, attest that this documentation has been prepared under the direction and in the presence of Merri Ray, MD. Electronically Signed: Moises Blood, [place]. 01/26/2018 , 12:20 PM .  Patient was seen in Room 9 .   Patient ID: Laurie Bond, female    DOB: 08-06-1970, 47 y.o.   MRN: [id number] Chief Complaint  Patient presents with  . Medication Refill    prozac,trazodone   . Abdominal Pain    left lower side    HPI Laurie Bond is a 47 y.o. female  Patient is here for medication refill. She is a new patient to me and here to establish care. She was previously seen by Harrison Mons; last seen Oct 2018. She also complains of LLQ abdominal pain.   She works as a Marine scientist; Designer, industrial/product at [employer].   Depression with insomnia She is on Prozac 40 mg qd and trazodone 50 mg qhs prn.   Patient states she's doing well on current regime and dose. She notes taking trazodone pretty much every night due to history of insomnia. She's been on Prozac for about 20 years now. She denies SI/HI.   Depression screen [facility] 2/9 01/26/2018 02/04/2017 01/27/2016 12/31/2015 10/01/2014  Decreased Interest 0 0 0 0 0  Down, Depressed, Hopeless 0 0 0 0 0  PHQ - 2 Score 0 0 0 0 0    LLQ abdominal pain She reports having intermittent LLQ abdominal pain, describes being crampy and sensation of squeezing and clutching over the area. She states worst pain on Sunday (Oct 6th) that was more constant throughout the day. She thought about going to the ER, but chose to hold off. She likes drinking strong coffee, but hasn't recently. She denies nausea, vomiting, fever, diarrhea, bowel symptoms, constipation, blood in stool, unexplained night weights, weight loss, chills, or urinary symptoms. She denies abdominal pain associated with foods. She has a history of cholecystectomy. She's taken ibuprofen to have some improvement. She denies history of  peptic or gastric ulcers. She denies family history of colon cancer; hasn't had colonoscopy done yet. She mentions father with history of diverticulitis. She drinks rare alcohol, about once a week.   She is on oral contraceptives; denies any missed doses.   Patient Active Problem List   Diagnosis Date Noted  . Insomnia 01/27/2016  . Hyperlipidemia 03/07/2012  . Depression 03/07/2012  . Immune to varicella 03/07/2006   Past Medical History:  Diagnosis Date  . Cholecystitis with cholelithiasis 03/15/2015  . Depression   . Dyspepsia and disorder of function of stomach 01/16/2014   Mild reflux symptoms, effectively reduced with Prilosec.   Marland Kitchen Gestational diabetes 2010   2nd of 2 pregnancies  . Hyperlipidemia   . Obesity   . Pre-eclampsia 2004  . Pre-eclampsia 2004   1st of 2 pregnancies   Past Surgical History:  Procedure Laterality Date  . LAPAROSCOPIC CHOLECYSTECTOMY    . TUBAL LIGATION    . WISDOM TOOTH EXTRACTION     Allergies  Allergen Reactions  . Pravastatin Other (See Comments)    Severe myalgias   Prior to Admission medications   Medication Sig Start Date End Date Taking? Authorizing Provider  FLUoxetine (PROZAC) 40 MG capsule Take 1 capsule (40 mg total) by mouth daily. 02/04/17   Harrison Mons, PA  norethindrone-ethinyl estradiol (GILDESS 1/20) 1-20 MG-MCG tablet Take 1 tablet by mouth daily. Continuous use because of Dysmenorrhea 07/30/16  Princess Bruins, MD  norethindrone-ethinyl estradiol (JUNEL FE 1/20) 1-20 MG-MCG tablet Take 1 tablet by mouth daily. 09/09/17   Princess Bruins, MD  traZODone (DESYREL) 50 MG tablet Take 1 tablet (50 mg total) by mouth at bedtime. 02/04/17   Harrison Mons, PA   Social History   Socioeconomic History  . Marital status: Married    Spouse name: Shanon Brow  . Number of children: 2  . Years of education: 49  . Highest education level: Not on file  Occupational History  . Occupation: Programmer, multimedia: [employer]  Social Needs  . Financial resource strain: Not on file  . Food insecurity:    Worry: Not on file    Inability: Not on file  . Transportation needs:    Medical: Not on file    Non-medical: Not on file  Tobacco Use  . Smoking status: Never Smoker  . Smokeless tobacco: Never Used  Substance and Sexual Activity  . Alcohol use: Yes    Alcohol/week: 0.0 - 1.0 standard drinks    Comment: once a week   . Drug use: No  . Sexual activity: Yes    Partners: Male    Birth control/protection: Pill    Comment: 1st intercourse- 16, partners- 36, married- 60 yrs   Lifestyle  . Physical activity:    Days per week: Not on file    Minutes per session: Not on file  . Stress: Not on file  Relationships  . Social connections:    Talks on phone: Not on file    Gets together: Not on file    Attends religious service: Not on file    Active member of club or organization: Not on file    Attends meetings of clubs or organizations: Not on file    Relationship status: Not on file  . Intimate partner violence:    Fear of current or ex partner: Not on file    Emotionally abused: Not on file    Physically abused: Not on file    Forced sexual activity: Not on file  Other Topics Concern  . Not on file  Social History Narrative   Lives with husband and 2 children.   Review of Systems  Constitutional: Negative for activity change, appetite change, chills, diaphoresis, fatigue, fever and unexpected weight change.  Respiratory: Negative for cough.   Gastrointestinal: Positive for abdominal pain. Negative for blood in stool, constipation, diarrhea, nausea and vomiting.  Genitourinary: Negative for dysuria, hematuria and urgency.  Skin: Negative for rash and wound.  Neurological: Negative for dizziness, weakness and headaches.       Objective:   Physical Exam  Constitutional: She is oriented to person, place, and time. She appears well-developed and well-nourished. No distress.  HENT:  Head:  Normocephalic and atraumatic.  Eyes: Pupils are equal, round, and reactive to light. EOM are normal.  Neck: Neck supple.  Cardiovascular: Normal rate.  Pulmonary/Chest: Effort normal. No respiratory distress.  Abdominal: There is tenderness (slight) in the left upper quadrant.  Musculoskeletal: Normal range of motion.  Neurological: She is alert and oriented to person, place, and time.  Skin: Skin is warm and dry.  Psychiatric: She has a normal mood and affect. Her behavior is normal.  Nursing note and vitals reviewed.   Vitals:   01/26/18 1132  BP: 132/83  Pulse: 77  Resp: 18  Temp: 98.4 F (36.9 C)  TempSrc: Oral  SpO2: 99%  Weight: 175 lb 6.4 oz (  79.6 kg)  Height: 5\' 7"  (1.702 m)   Results for orders placed or performed in visit on 01/26/18  POCT CBC  Result Value Ref Range   WBC 7.9 4.6 - 10.2 K/uL   Lymph, poc 1.9 0.6 - 3.4   POC LYMPH PERCENT 23.5 10 - 50 %L   MID (cbc) 0.4 0 - 0.9   POC MID % 5.0 0 - 12 %M   POC Granulocyte 5.6 2 - 6.9   Granulocyte percent 71.5 37 - 80 %G   RBC 4.51 4.04 - 5.48 M/uL   Hemoglobin 13.4 12.2 - 16.2 g/dL   HCT, POC 40.2 37.7 - 47.9 %   MCV 89.1 80 - 97 fL   MCH, POC 29.7 27 - 31.2 pg   MCHC 33.4 31.8 - 35.4 g/dL   RDW, POC 12.6 %   Platelet Count, POC 334 142 - 424 K/uL   MPV 6.2 0 - 99.8 fL  POCT urinalysis dipstick  Result Value Ref Range   Color, UA yellow yellow   Clarity, UA clear clear   Glucose, UA negative negative mg/dL   Bilirubin, UA negative negative   Ketones, POC UA negative negative mg/dL   Spec Grav, UA 1.010 1.010 - 1.025   Blood, UA small (A) negative   pH, UA 6.5 5.0 - 8.0   Protein Ur, POC negative negative mg/dL   Urobilinogen, UA 0.2 0.2 or 1.0 E.U./dL   Nitrite, UA Negative Negative   Leukocytes, UA Negative Negative  POCT Microscopic Urinalysis ([facility])  Result Value Ref Range   WBC,UR,HPF,POC None None WBC/hpf   RBC,UR,HPF,POC None None RBC/hpf   Bacteria None None, Too numerous to count    Mucus Absent Absent   Epithelial Cells, UR Per Microscopy Few (A) None, Too numerous to count cells/hpf       Assessment & Plan:   Laurie Bond is a 47 y.o. female LLQ abdominal pain - Plan: POCT CBC, POCT urinalysis dipstick, POCT Microscopic Urinalysis ([facility]) Generalized abdominal pain - Plan: CT Abdomen Pelvis W Contrast, Comprehensive metabolic panel LUQ abdominal pain - Plan: POCT CBC, POCT urinalysis dipstick, POCT Microscopic Urinalysis ([facility]), Lipase, Comprehensive metabolic panel  -Reassuring CBC, urinalysis.  Check lipase and CMP, but also order CT given recurrent pain and most recent flare more significant.  RTC/ER precautions if acute worsening  Depression, unspecified depression type - Plan: FLUoxetine (PROZAC) 40 MG capsule Insomnia, unspecified type - Plan: traZODone (DESYREL) 50 MG tablet  -Stable control, continue Prozac same dose, trazodone at bedtime same dose.  Recheck next few months for physical.   Meds ordered this encounter  Medications  . FLUoxetine (PROZAC) 40 MG capsule    Sig: Take 1 capsule (40 mg total) by mouth daily.    Dispense:  90 capsule    Refill:  3  . traZODone (DESYREL) 50 MG tablet    Sig: Take 1 tablet (50 mg total) by mouth at bedtime.    Dispense:  90 tablet    Refill:  3   Patient Instructions   No change in Prozac or trazodone dosing.  Follow-up for physical next few months if possible.  Blood counts overall reassuring in the office, and urinalysis also overall reassuring.  I will check lipase, CMP.  With worsening pain recently, I did order a CT scan.  Further work-up to be determined by those results.  If any acute worsening symptoms return here or emergency room if needed.   Abdominal Pain, Adult Abdominal pain can  be caused by many things. Often, abdominal pain is not serious and it gets better with no treatment or by being treated at home. However, sometimes abdominal pain is serious. Your health care provider will do a  medical history and a physical exam to try to determine the cause of your abdominal pain. Follow these instructions at home:  Take over-the-counter and prescription medicines only as told by your health care provider. Do not take a laxative unless told by your health care provider.  Drink enough fluid to keep your urine clear or pale yellow.  Watch your condition for any changes.  Keep all follow-up visits as told by your health care provider. This is important. Contact a health care provider if:  Your abdominal pain changes or gets worse.  You are not hungry or you lose weight without trying.  You are constipated or have diarrhea for more than 2-3 days.  You have pain when you urinate or have a bowel movement.  Your abdominal pain wakes you up at night.  Your pain gets worse with meals, after eating, or with certain foods.  You are throwing up and cannot keep anything down.  You have a fever. Get help right away if:  Your pain does not go away as soon as your health care provider told you to expect.  You cannot stop throwing up.  Your pain is only in areas of the abdomen, such as the right side or the left lower portion of the abdomen.  You have bloody or black stools, or stools that look like tar.  You have severe pain, cramping, or bloating in your abdomen.  You have signs of dehydration, such as: ? Dark urine, very little urine, or no urine. ? Cracked lips. ? Dry mouth. ? Sunken eyes. ? Sleepiness. ? Weakness. This information is not intended to replace advice given to you by your health care provider. Make sure you discuss any questions you have with your health care provider. Document Released: 01/13/2005 Document Revised: 10/24/2015 Document Reviewed: 09/17/2015 Elsevier Interactive Patient Education  [employer].    If you have lab work done today you will be contacted with your lab results within the next 2 weeks.  If you have not heard from [place] then  please contact us. The fastest way to get your results is to register for My Chart.   IF you received an x-ray today, you will receive an invoice from [facility]. Please contact [facility] at [phone] with questions or concerns regarding your invoice.   IF you received labwork today, you will receive an invoice from [place]. Please contact [facility] at [phone] with questions or concerns regarding your invoice.   Our billing staff will not be able to assist you with questions regarding bills from these companies.  You will be contacted with the lab results as soon as they are available. The fastest way to get your results is to activate your My Chart account. Instructions are located on the last page of this paperwork. If you have not heard from [place] regarding the results in 2 weeks, please contact this office.     I personally performed the services described in this documentation, which was scribed in my presence. The recorded information has been reviewed and considered for accuracy and completeness, addended by me as needed, and agree with information above.  Signed,   Merri Ray, MD [facility].  01/30/18 12:16 PM

## 2018-01-26 NOTE — Patient Instructions (Addendum)
No change in Prozac or trazodone dosing.  Follow-up for physical next few months if possible.  Blood counts overall reassuring in the office, and urinalysis also overall reassuring.  I will check lipase, CMP.  With worsening pain recently, I did order a CT scan.  Further work-up to be determined by those results.  If any acute worsening symptoms return here or emergency room if needed.   Abdominal Pain, Adult Abdominal pain can be caused by many things. Often, abdominal pain is not serious and it gets better with no treatment or by being treated at home. However, sometimes abdominal pain is serious. Your health care provider will do a medical history and a physical exam to try to determine the cause of your abdominal pain. Follow these instructions at home:  Take over-the-counter and prescription medicines only as told by your health care provider. Do not take a laxative unless told by your health care provider.  Drink enough fluid to keep your urine clear or pale yellow.  Watch your condition for any changes.  Keep all follow-up visits as told by your health care provider. This is important. Contact a health care provider if:  Your abdominal pain changes or gets worse.  You are not hungry or you lose weight without trying.  You are constipated or have diarrhea for more than 2-3 days.  You have pain when you urinate or have a bowel movement.  Your abdominal pain wakes you up at night.  Your pain gets worse with meals, after eating, or with certain foods.  You are throwing up and cannot keep anything down.  You have a fever. Get help right away if:  Your pain does not go away as soon as your health care provider told you to expect.  You cannot stop throwing up.  Your pain is only in areas of the abdomen, such as the right side or the left lower portion of the abdomen.  You have bloody or black stools, or stools that look like tar.  You have severe pain, cramping, or bloating  in your abdomen.  You have signs of dehydration, such as: ? Dark urine, very little urine, or no urine. ? Cracked lips. ? Dry mouth. ? Sunken eyes. ? Sleepiness. ? Weakness. This information is not intended to replace advice given to you by your health care provider. Make sure you discuss any questions you have with your health care provider. Document Released: 01/13/2005 Document Revised: 10/24/2015 Document Reviewed: 09/17/2015 Elsevier Interactive Patient Education  [employer].    If you have lab work done today you will be contacted with your lab results within the next 2 weeks.  If you have not heard from [place] then please contact us. The fastest way to get your results is to register for My Chart.   IF you received an x-ray today, you will receive an invoice from [facility]. Please contact [facility] at [phone] with questions or concerns regarding your invoice.   IF you received labwork today, you will receive an invoice from [place]. Please contact [facility] at [phone] with questions or concerns regarding your invoice.   Our billing staff will not be able to assist you with questions regarding bills from these companies.  You will be contacted with the lab results as soon as they are available. The fastest way to get your results is to activate your My Chart account. Instructions are located on the last page of this paperwork. If you have not heard from  [place] regarding the results in 2 weeks, please contact this office.

## 2018-01-27 LAB — COMPREHENSIVE METABOLIC PANEL
ALT: 11 IU/L (ref 0–32)
AST: 17 IU/L (ref 0–40)
Albumin/Globulin Ratio: 1.7 (ref 1.2–2.2)
Albumin: 4.5 g/dL (ref 3.5–5.5)
Alkaline Phosphatase: 70 IU/L (ref 39–117)
BILIRUBIN TOTAL: 0.3 mg/dL (ref 0.0–1.2)
BUN/Creatinine Ratio: 13 (ref 9–23)
BUN: 10 mg/dL (ref 6–24)
CALCIUM: 9.8 mg/dL (ref 8.7–10.2)
CHLORIDE: 100 mmol/L (ref 96–106)
CO2: 21 mmol/L (ref 20–29)
CREATININE: 0.77 mg/dL (ref 0.57–1.00)
GFR, EST AFRICAN AMERICAN: 106 mL/min/{1.73_m2} (ref >59)
GFR, EST NON AFRICAN AMERICAN: 92 mL/min/{1.73_m2} (ref >59)
GLUCOSE: 81 mg/dL (ref 65–99)
Globulin, Total: 2.6 g/dL (ref 1.5–4.5)
Potassium: 4.5 mmol/L (ref 3.5–5.2)
Sodium: 137 mmol/L (ref 134–144)
Total Protein: 7.1 g/dL (ref 6.0–8.5)

## 2018-01-27 LAB — LIPASE: Lipase: 32 U/L (ref 14–72)

## 2018-01-31 ENCOUNTER — Ambulatory Visit
Admission: RE | Admit: 2018-01-31 | Discharge: 2018-01-31 | Disposition: A | Discharge status: Home / Self Care | Payer: 59 | Source: Ambulatory Visit | Attending: Family Medicine | Admitting: Family Medicine

## 2018-01-31 DIAGNOSIS — R1084 Generalized abdominal pain: Secondary | ICD-10-CM

## 2018-01-31 DIAGNOSIS — K5730 Diverticulosis of large intestine without perforation or abscess without bleeding: Secondary | ICD-10-CM | POA: Diagnosis not present

## 2018-01-31 MED ORDER — IOPAMIDOL (ISOVUE-300) INJECTION 61%
100.0000 mL | Freq: Once | INTRAVENOUS | Status: AC | PRN
  Administered 2018-01-31: 100 mL via INTRAVENOUS

## 2018-03-07 ENCOUNTER — Other Ambulatory Visit: Payer: Self-pay

## 2018-03-07 ENCOUNTER — Encounter: Payer: Self-pay | Admitting: Family Medicine

## 2018-03-07 ENCOUNTER — Ambulatory Visit: Payer: 59 | Admitting: Family Medicine

## 2018-03-07 VITALS — BP 131/82 | HR 74 | Temp 98.7°F | Ht 67.0 in | Wt 183.2 lb

## 2018-03-07 DIAGNOSIS — R109 Unspecified abdominal pain: Secondary | ICD-10-CM

## 2018-03-07 DIAGNOSIS — E785 Hyperlipidemia, unspecified: Secondary | ICD-10-CM | POA: Diagnosis not present

## 2018-03-07 DIAGNOSIS — R002 Palpitations: Secondary | ICD-10-CM | POA: Diagnosis not present

## 2018-03-07 DIAGNOSIS — Z0001 Encounter for general adult medical examination with abnormal findings: Secondary | ICD-10-CM | POA: Diagnosis not present

## 2018-03-07 DIAGNOSIS — R1012 Left upper quadrant pain: Secondary | ICD-10-CM

## 2018-03-07 DIAGNOSIS — R6881 Early satiety: Secondary | ICD-10-CM

## 2018-03-07 DIAGNOSIS — Z0000 Encounter for general adult medical examination without abnormal findings: Secondary | ICD-10-CM

## 2018-03-07 NOTE — Patient Instructions (Addendum)
No reason for abdominal pain on CT scan.  I would recommend meeting with GI to see if they recommend other testing, but pain could be muscular/abdominal wall pain as well. Tylenol for now may be best, and some gentle stretches twice per day to see if that helps.   I will check some blood work for the palpitations as well as cholesterol.  See information below.  Please follow-up if palpitations worsen.   Abdominal Pain, Adult Abdominal pain can be caused by many things. Often, abdominal pain is not serious and it gets better with no treatment or by being treated at home. However, sometimes abdominal pain is serious. Your health care provider will do a medical history and a physical exam to try to determine the cause of your abdominal pain. Follow these instructions at home:  Take over-the-counter and prescription medicines only as told by your health care provider. Do not take a laxative unless told by your health care provider.  Drink enough fluid to keep your urine clear or pale yellow.  Watch your condition for any changes.  Keep all follow-up visits as told by your health care provider. This is important. Contact a health care provider if:  Your abdominal pain changes or gets worse.  You are not hungry or you lose weight without trying.  You are constipated or have diarrhea for more than 2-3 days.  You have pain when you urinate or have a bowel movement.  Your abdominal pain wakes you up at night.  Your pain gets worse with meals, after eating, or with certain foods.  You are throwing up and cannot keep anything down.  You have a fever. Get help right away if:  Your pain does not go away as soon as your health care provider told you to expect.  You cannot stop throwing up.  Your pain is only in areas of the abdomen, such as the right side or the left lower portion of the abdomen.  You have bloody or black stools, or stools that look like tar.  You have severe pain,  cramping, or bloating in your abdomen.  You have signs of dehydration, such as: ? Dark urine, very little urine, or no urine. ? Cracked lips. ? Dry mouth. ? Sunken eyes. ? Sleepiness. ? Weakness. This information is not intended to replace advice given to you by your health care provider. Make sure you discuss any questions you have with your health care provider. Document Released: 01/13/2005 Document Revised: 10/24/2015 Document Reviewed: 09/17/2015 Elsevier Interactive Patient Education  2018 [employer].   Palpitations A palpitation is the feeling that your heartbeat is irregular or is faster than normal. It may feel like your heart is fluttering or skipping a beat. Palpitations are usually not a serious problem. They may be caused by many things, including smoking, caffeine, alcohol, stress, and certain medicines. Although most causes of palpitations are not serious, palpitations can be a sign of a serious medical problem. In some cases, you may need further medical evaluation. Follow these instructions at home: Pay attention to any changes in your symptoms. Take these actions to help with your condition:  Avoid the following: ? Caffeinated coffee, tea, soft drinks, diet pills, and energy drinks. ? Chocolate. ? Alcohol.  Do not use any tobacco products, such as cigarettes, chewing tobacco, and e-cigarettes. If you need help quitting, ask your health care provider.  Try to reduce your stress and anxiety. Things that can help you relax include: ? Yoga. ?  Meditation. ? Physical activity, such as swimming, jogging, or walking. ? Biofeedback. This is a method that helps you learn to use your mind to control things in your body, such as your heartbeats.  Get plenty of rest and sleep.  Take over-the-counter and prescription medicines only as told by your health care provider.  Keep all follow-up visits as told by your health care provider. This is important.  Contact a health  care provider if:  You continue to have a fast or irregular heartbeat after 24 hours.  Your palpitations occur more often. Get help right away if:  You have chest pain or shortness of breath.  You have a severe headache.  You feel dizzy or you faint. This information is not intended to replace advice given to you by your health care provider. Make sure you discuss any questions you have with your health care provider. Document Released: 04/02/2000 Document Revised: 09/08/2015 Document Reviewed: 12/19/2014 Elsevier Interactive Patient Education  2018 [employer]  Get These Tests 1. Blood Pressure- Have your blood pressure checked once a year by your health care provider.  Normal blood pressure is 120/80. 2. Weight- Have your body mass index (BMI) calculated to screen for obesity.  BMI is measure of body fat based on height and weight.  You can also calculate your own BMI at [employer]. 3. Cholesterol- Have your cholesterol checked every 5 years starting at age 58 then yearly starting at age 109. 6. Chlamydia, HIV, and other sexually transmitted diseases- Get screened every year until age 83, then within three months of each new sexual provider. 5. Pap Test - Every 1-5 years; discuss with your health care provider. 6. Mammogram- Every 1-2 years starting at age 66--50  Take these medicines  Calcium with Vitamin D-Your body needs 1200 mg of Calcium each day and [phone] IU of Vitamin D daily.  Your body can only absorb 500 mg of Calcium at a time so Calcium must be taken in 2 or 3 divided doses throughout the day.  Multivitamin with folic acid- Once daily if it is possible for you to become pregnant.  Get these Immunizations  Gardasil-Series of three doses; prevents HPV related illness such as genital warts and cervical cancer.  Menactra-Single dose; prevents meningitis.  Tetanus shot- Every 10 years.  Flu shot-Every year.  Take these  steps 1. Do not smoke-Your healthcare provider can help you quit.  For tips on how to quit go to www.smokefree.gov or call 1-800 QUITNOW. 2. Be physically active- Exercise 5 days a week for at least 30 minutes.  If you are not already physically active, start slow and gradually work up to 30 minutes of moderate physical activity.  Examples of moderate activity include walking briskly, dancing, swimming, bicycling, etc. 3. Breast Cancer- A self breast exam every month is important for early detection of breast cancer.  For more information and instruction on self breast exams, ask your healthcare provider or https://www.patel.info/. 4. Eat a healthy diet- Eat a variety of healthy foods such as fruits, vegetables, whole grains, low fat milk, low fat cheeses, yogurt, lean meats, poultry and fish, beans, nuts, tofu, etc.  For more information go to www. Thenutritionsource.org 5. Drink alcohol in moderation- Limit alcohol intake to one drink or less per day. Never drink and drive. 6. Depression- Your emotional health is as important as your physical health.  If you're feeling down or losing interest in things you normally enjoy please talk  to your healthcare provider about being screened for depression. 7. Dental visit- Brush and floss your teeth twice daily; visit your dentist twice a year. 8. Eye doctor- Get an eye exam at least every 2 years. 9. Helmet use- Always wear a helmet when riding a bicycle, motorcycle, rollerblading or skateboarding. 63. Safe sex- If you may be exposed to sexually transmitted infections, use a condom. 11. Seat belts- Seat belts can save your live; always wear one. 12. Smoke/Carbon Monoxide detectors- These detectors need to be installed on the appropriate level of your home. Replace batteries at least once a year. 13. Skin cancer- When out in the sun please cover up and use sunscreen 15 SPF or higher. 14. Violence- If anyone is threatening or hurting you,  please tell your healthcare provider.         If you have lab work done today you will be contacted with your lab results within the next 2 weeks.  If you have not heard from [place] then please contact us. The fastest way to get your results is to register for My Chart.   IF you received an x-ray today, you will receive an invoice from [facility]. Please contact [facility] at [phone] with questions or concerns regarding your invoice.   IF you received labwork today, you will receive an invoice from [facility]. Please contact [facility] at [phone] with questions or concerns regarding your invoice.   Our billing staff will not be able to assist you with questions regarding bills from these companies.  You will be contacted with the lab results as soon as they are available. The fastest way to get your results is to activate your My Chart account. Instructions are located on the last page of this paperwork. If you have not heard from [place] regarding the results in 2 weeks, please contact this office.

## 2018-03-07 NOTE — Progress Notes (Addendum)
Subjective:  By signing my name below, I, Moises Blood, attest that this documentation has been prepared under the direction and in the presence of Merri Ray, MD. Electronically Signed: Moises Blood, [place]. 03/07/2018 , 11:28 AM .  Patient was seen in Room 12 .   Patient ID: Laurie Bond, female    DOB: November 03, 1970, 47 y.o.   MRN: [id number] Chief Complaint  Patient presents with  . Annual Exam    CPE   HPI Laurie Bond is a 47 y.o. female Here for annual physical. She has a history of depression and insomnia.   She works as a Marine scientist; Designer, industrial/product at [employer].   ROS complaints Heart palpitations: She was informed history of murmur. She notes this has been present for several years. She had an echo done when she had her son but without significant findings. She denies any change in frequency recently. She notes this somewhat positional. She denies chest pain with the palpitations.  Appetite change: Sometimes, she feels more full with early satiety, which started around the time her abdominal pain started, which was on Oct 6th. She denies being followed by GI currently.   LUQ abdominal pain See previous note on Oct 10th, 2019. She had a reassuring CBC, and urinalysis. She had a normal CMP and lipase. She also had CT of abdomen and pelvis on Oct 15th, 2019, which showed minimal colonic diverticulosis without evidence diverticulitis. Otherwise, no explanation patient's left lower abdominal pain. Specifically, no evidence of enteric or urinary obstruction.   Patient states she's still having soreness, more so with stretching and with movement. When she goes to the gym and even on the treadmill, the area becomes sore so she hasn't been going to the gym as much. She hasn't spoken to her OBGYN regarding this issue. She reports the pain is present everyday, and as the day progresses, her soreness worsens. She was taking ibuprofen for this but stopped because she  thought it might make it worse. She tried taking OTC prilosec without relief. She also tried Activia yogurt without relief either. She does take tylenol sometimes for this pain. She denies urinary symptoms with abdominal pain. She reports abdominal pain has been present since Oct 6th; since then, she's had a persistent nagging pain.   Depression + insomnia She takes Prozac 40 mg qd and trazodone 50 mg qhs. Her symptoms were well controlled when discussed in Oct.   Cancer Screening Mammogram: last done on 08/16/17.  Cervical cancer screening: she's followed by OBGYN with negative pap on 07/30/16.   Immunizations Immunization History  Administered Date(s) Administered  . Hepatitis A 01/25/2006, 08/23/2006  . Hepatitis B 03/02/2006, 04/21/2006, 08/23/2006  . Influenza Split 01/31/2008, 12/27/2008, 01/21/2010, 01/18/2012, 01/17/2013  . Influenza,inj,Quad PF,6+ Mos 02/04/2017  . Influenza-Unspecified 01/15/2014  . MMR 05/28/1970, 01/24/1990  . PPD Test 02/18/2007, 02/10/2008, 06/18/2008, 07/08/2009, 09/17/2010  . Tdap 01/25/2006, 01/27/2016   Flu shot: 02/04/18, received at work.   STI screening She declines need for STI testing.   Depression Depression screen [facility] 2/9 03/07/2018 01/26/2018 02/04/2017 01/27/2016 12/31/2015  Decreased Interest 0 0 0 0 0  Down, Depressed, Hopeless 0 0 0 0 0  PHQ - 2 Score 0 0 0 0 0   See depression info above.   Vision  Visual Acuity Screening   Right eye Left eye Both eyes  Without correction:     With correction: 20/20-2 20/40-1 20/25   Eye doctor: seen eye doctor recently in  July 2019. She wears multifocal contacts.   Dentist She is followed by dentist regularly.   Exercise She tries to go to the gym or walks about 4 times a week.   Lipid screening Last checked on Oct 2018.   Lab Results  Component Value Date   CHOL 235 (H) 02/04/2017   HDL 55 02/04/2017   LDLCALC 140 (H) 02/04/2017   TRIG 202 (H) 02/04/2017   CHOLHDL 4.3 02/04/2017     She was on a statin (pravastatin) in the past for a while, but she had muscle spasms. After she stopped it, her muscle spasms resolved after a week without it.   Patient Active Problem List   Diagnosis Date Noted  . Insomnia 01/27/2016  . Hyperlipidemia 03/07/2012  . Depression 03/07/2012  . Immune to varicella 03/07/2006   Past Medical History:  Diagnosis Date  . Cholecystitis with cholelithiasis 03/15/2015  . Depression   . Dyspepsia and disorder of function of stomach 01/16/2014   Mild reflux symptoms, effectively reduced with Prilosec.   Marland Kitchen Gestational diabetes 2010   2nd of 2 pregnancies  . Hyperlipidemia   . Obesity   . Pre-eclampsia 2004  . Pre-eclampsia 2004   1st of 2 pregnancies   Past Surgical History:  Procedure Laterality Date  . LAPAROSCOPIC CHOLECYSTECTOMY    . TUBAL LIGATION    . WISDOM TOOTH EXTRACTION     Allergies  Allergen Reactions  . Pravastatin Other (See Comments)    Severe myalgias   Prior to Admission medications   Medication Sig Start Date End Date Taking? Authorizing Provider  FLUoxetine (PROZAC) 40 MG capsule Take 1 capsule (40 mg total) by mouth daily. 01/26/18   Wendie Agreste, MD  norethindrone-ethinyl estradiol Jaci Lazier 1/20) 1-20 MG-MCG tablet Take 1 tablet by mouth daily. Continuous use because of Dysmenorrhea 07/30/16   Princess Bruins, MD  norethindrone-ethinyl estradiol (JUNEL FE 1/20) 1-20 MG-MCG tablet Take 1 tablet by mouth daily. 09/09/17   Princess Bruins, MD  traZODone (DESYREL) 50 MG tablet Take 1 tablet (50 mg total) by mouth at bedtime. 01/26/18   Wendie Agreste, MD   Social History   Socioeconomic History  . Marital status: Married    Spouse name: Shanon Brow  . Number of children: 2  . Years of education: 54  . Highest education level: Not on file  Occupational History  . Occupation: Programmer, multimedia: [employer]  Social Needs  . Financial resource strain: Not on file  . Food insecurity:     Worry: Not on file    Inability: Not on file  . Transportation needs:    Medical: Not on file    Non-medical: Not on file  Tobacco Use  . Smoking status: Never Smoker  . Smokeless tobacco: Never Used  Substance and Sexual Activity  . Alcohol use: Yes    Alcohol/week: 0.0 - 1.0 standard drinks    Comment: once a week   . Drug use: No  . Sexual activity: Yes    Partners: Male    Birth control/protection: Pill    Comment: 1st intercourse- 16, partners- 65, married- 61 yrs   Lifestyle  . Physical activity:    Days per week: Not on file    Minutes per session: Not on file  . Stress: Not on file  Relationships  . Social connections:    Talks on phone: Not on file    Gets together: Not on file    Attends religious  service: Not on file    Active member of club or organization: Not on file    Attends meetings of clubs or organizations: Not on file    Relationship status: Not on file  . Intimate partner violence:    Fear of current or ex partner: Not on file    Emotionally abused: Not on file    Physically abused: Not on file    Forced sexual activity: Not on file  Other Topics Concern  . Not on file  Social History Narrative   Lives with husband and 2 children.   Review of Systems 13 point ROS - positive for left upper abdominal pain, heart palpitations, and appetite change     Objective:   Physical Exam  Constitutional: She is oriented to person, place, and time. She appears well-developed and well-nourished.  HENT:  Head: Normocephalic and atraumatic.  Right Ear: External ear normal.  Left Ear: External ear normal.  Mouth/Throat: Oropharynx is clear and moist.  Eyes: Pupils are equal, round, and reactive to light. Conjunctivae are normal.  Neck: Normal range of motion. Neck supple. No thyromegaly present.  Cardiovascular: Normal rate, regular rhythm, normal heart sounds and intact distal pulses.  No murmur heard. Pulmonary/Chest: Effort normal and breath sounds normal.  No respiratory distress. She has no wheezes.  Abdominal: Soft. Bowel sounds are normal.  Slightly tender over LUQ to left middle abdomen, reproduces pain with sitting up  Musculoskeletal: Normal range of motion. She exhibits no edema or tenderness.  Lymphadenopathy:    She has no cervical adenopathy.  Neurological: She is alert and oriented to person, place, and time.  Skin: Skin is warm and dry. No rash noted.  Psychiatric: She has a normal mood and affect. Her behavior is normal. Thought content normal.  Vitals reviewed.   Vitals:   03/07/18 1048  BP: 131/82  Pulse: 74  Temp: 98.7 F (37.1 C)  TempSrc: Oral  SpO2: 99%  Weight: 183 lb 3.2 oz (83.1 kg)  Height: '5\' 7"'  (1.702 m)      Assessment & Plan:   KHALESSI BLOUGH is a 47 y.o. female Annual physical exam  - -anticipatory guidance as below in AVS, screening labs above. Health maintenance items as above in HPI discussed/recommended as applicable.   Abdominal wall pain - Plan: Ambulatory referral to Gastroenterology LUQ abdominal pain - Plan: Ambulatory referral to Gastroenterology Early satiety - Plan: Ambulatory referral to Gastroenterology  -Reassuring CT.  Refer to GI for further evaluation, but may be abdominal wall source based on symptoms above.  Tylenol, gentle stretches, RTC precautions/ER precautions if acutely worsening.  Hyperlipidemia, unspecified hyperlipidemia type - Plan: Lipid panel  Palpitations - Plan: TSH  -Check TSH.  Longstanding symptoms without recent changes.  RTC precautions if more frequent or worsening  No orders of the defined types were placed in this encounter.  Patient Instructions   No reason for abdominal pain on CT scan.  I would recommend meeting with GI to see if they recommend other testing, but pain could be muscular/abdominal wall pain as well. Tylenol for now may be best, and some gentle stretches twice per day to see if that helps.   I will check some blood work for the  palpitations as well as cholesterol.  See information below.  Please follow-up if palpitations worsen.   Abdominal Pain, Adult Abdominal pain can be caused by many things. Often, abdominal pain is not serious and it gets better with no treatment or by being  treated at home. However, sometimes abdominal pain is serious. Your health care provider will do a medical history and a physical exam to try to determine the cause of your abdominal pain. Follow these instructions at home:  Take over-the-counter and prescription medicines only as told by your health care provider. Do not take a laxative unless told by your health care provider.  Drink enough fluid to keep your urine clear or pale yellow.  Watch your condition for any changes.  Keep all follow-up visits as told by your health care provider. This is important. Contact a health care provider if:  Your abdominal pain changes or gets worse.  You are not hungry or you lose weight without trying.  You are constipated or have diarrhea for more than 2-3 days.  You have pain when you urinate or have a bowel movement.  Your abdominal pain wakes you up at night.  Your pain gets worse with meals, after eating, or with certain foods.  You are throwing up and cannot keep anything down.  You have a fever. Get help right away if:  Your pain does not go away as soon as your health care provider told you to expect.  You cannot stop throwing up.  Your pain is only in areas of the abdomen, such as the right side or the left lower portion of the abdomen.  You have bloody or black stools, or stools that look like tar.  You have severe pain, cramping, or bloating in your abdomen.  You have signs of dehydration, such as: ? Dark urine, very little urine, or no urine. ? Cracked lips. ? Dry mouth. ? Sunken eyes. ? Sleepiness. ? Weakness. This information is not intended to replace advice given to you by your health care provider. Make sure  you discuss any questions you have with your health care provider. Document Released: 01/13/2005 Document Revised: 10/24/2015 Document Reviewed: 09/17/2015 Elsevier Interactive Patient Education  2018 [employer].   Palpitations A palpitation is the feeling that your heartbeat is irregular or is faster than normal. It may feel like your heart is fluttering or skipping a beat. Palpitations are usually not a serious problem. They may be caused by many things, including smoking, caffeine, alcohol, stress, and certain medicines. Although most causes of palpitations are not serious, palpitations can be a sign of a serious medical problem. In some cases, you may need further medical evaluation. Follow these instructions at home: Pay attention to any changes in your symptoms. Take these actions to help with your condition:  Avoid the following: ? Caffeinated coffee, tea, soft drinks, diet pills, and energy drinks. ? Chocolate. ? Alcohol.  Do not use any tobacco products, such as cigarettes, chewing tobacco, and e-cigarettes. If you need help quitting, ask your health care provider.  Try to reduce your stress and anxiety. Things that can help you relax include: ? Yoga. ? Meditation. ? Physical activity, such as swimming, jogging, or walking. ? Biofeedback. This is a method that helps you learn to use your mind to control things in your body, such as your heartbeats.  Get plenty of rest and sleep.  Take over-the-counter and prescription medicines only as told by your health care provider.  Keep all follow-up visits as told by your health care provider. This is important.  Contact a health care provider if:  You continue to have a fast or irregular heartbeat after 24 hours.  Your palpitations occur more often. Get help right away if:  You  have chest pain or shortness of breath.  You have a severe headache.  You feel dizzy or you faint. This information is not intended to replace  advice given to you by your health care provider. Make sure you discuss any questions you have with your health care provider. Document Released: 04/02/2000 Document Revised: 09/08/2015 Document Reviewed: 12/19/2014 Elsevier Interactive Patient Education  2018 [employer]  Get These Tests 1. Blood Pressure- Have your blood pressure checked once a year by your health care provider.  Normal blood pressure is 120/80. 2. Weight- Have your body mass index (BMI) calculated to screen for obesity.  BMI is measure of body fat based on height and weight.  You can also calculate your own BMI at GravelBags.it. 3. Cholesterol- Have your cholesterol checked every 5 years starting at age 36 then yearly starting at age 42. 34. Chlamydia, HIV, and other sexually transmitted diseases- Get screened every year until age 29, then within three months of each new sexual provider. 5. Pap Test - Every 1-5 years; discuss with your health care provider. 6. Mammogram- Every 1-2 years starting at age 48--50  Take these medicines  Calcium with Vitamin D-Your body needs 1200 mg of Calcium each day and [phone] IU of Vitamin D daily.  Your body can only absorb 500 mg of Calcium at a time so Calcium must be taken in 2 or 3 divided doses throughout the day.  Multivitamin with folic acid- Once daily if it is possible for you to become pregnant.  Get these Immunizations  Gardasil-Series of three doses; prevents HPV related illness such as genital warts and cervical cancer.  Menactra-Single dose; prevents meningitis.  Tetanus shot- Every 10 years.  Flu shot-Every year.  Take these steps 1. Do not smoke-Your healthcare provider can help you quit.  For tips on how to quit go to www.smokefree.gov or call 1-800 QUITNOW. 2. Be physically active- Exercise 5 days a week for at least 30 minutes.  If you are not already physically active, start slow and gradually work up to 30 minutes of  moderate physical activity.  Examples of moderate activity include walking briskly, dancing, swimming, bicycling, etc. 3. Breast Cancer- A self breast exam every month is important for early detection of breast cancer.  For more information and instruction on self breast exams, ask your healthcare provider or https://www.patel.info/. 4. Eat a healthy diet- Eat a variety of healthy foods such as fruits, vegetables, whole grains, low fat milk, low fat cheeses, yogurt, lean meats, poultry and fish, beans, nuts, tofu, etc.  For more information go to www. Thenutritionsource.org 5. Drink alcohol in moderation- Limit alcohol intake to one drink or less per day. Never drink and drive. 6. Depression- Your emotional health is as important as your physical health.  If you're feeling down or losing interest in things you normally enjoy please talk to your healthcare provider about being screened for depression. 7. Dental visit- Brush and floss your teeth twice daily; visit your dentist twice a year. 8. Eye doctor- Get an eye exam at least every 2 years. 9. Helmet use- Always wear a helmet when riding a bicycle, motorcycle, rollerblading or skateboarding. 74. Safe sex- If you may be exposed to sexually transmitted infections, use a condom. 11. Seat belts- Seat belts can save your live; always wear one. 12. Smoke/Carbon Monoxide detectors- These detectors need to be installed on the appropriate level of your home. Replace batteries at least once a year.  13. Skin cancer- When out in the sun please cover up and use sunscreen 15 SPF or higher. 14. Violence- If anyone is threatening or hurting you, please tell your healthcare provider.         If you have lab work done today you will be contacted with your lab results within the next 2 weeks.  If you have not heard from [place] then please contact us. The fastest way to get your results is to register for My Chart.   IF you received an x-ray  today, you will receive an invoice from [facility]. Please contact [facility] at [phone] with questions or concerns regarding your invoice.   IF you received labwork today, you will receive an invoice from [facility] [place]. Please contact [facility] at [phone] with questions or concerns regarding your invoice.   Our billing staff will not be able to assist you with questions regarding bills from these companies.  You will be contacted with the lab results as soon as they are available. The fastest way to get your results is to activate your My Chart account. Instructions are located on the last page of this paperwork. If you have not heard from [place] regarding the results in 2 weeks, please contact this office.       I personally performed the services described in this documentation, which was scribed in my presence. The recorded information has been reviewed and considered for accuracy and completeness, addended by me as needed, and agree with information above.  Signed,   Merri Ray, MD [facility].  03/11/18 10:52 PM

## 2018-03-08 LAB — LIPID PANEL
CHOLESTEROL TOTAL: 236 mg/dL — AB (ref 100–199)
Chol/HDL Ratio: 4.1 ratio (ref 0.0–4.4)
HDL: 57 mg/dL (ref >39)
LDL CALC: 129 mg/dL — AB (ref 0–99)
TRIGLYCERIDES: 248 mg/dL — AB (ref 0–149)
VLDL CHOLESTEROL CAL: 50 mg/dL — AB (ref 5–40)

## 2018-03-08 LAB — TSH: TSH: 0.964 u[IU]/mL (ref 0.450–4.500)

## 2018-03-23 ENCOUNTER — Encounter: Payer: Self-pay | Admitting: Gastroenterology

## 2018-04-25 ENCOUNTER — Ambulatory Visit: Payer: 59 | Admitting: Gastroenterology

## 2018-04-25 ENCOUNTER — Encounter: Payer: Self-pay | Admitting: Gastroenterology

## 2018-04-25 VITALS — BP 130/77 | HR 100 | Ht 67.0 in | Wt 186.0 lb

## 2018-04-25 DIAGNOSIS — R1012 Left upper quadrant pain: Secondary | ICD-10-CM | POA: Diagnosis not present

## 2018-04-25 DIAGNOSIS — K625 Hemorrhage of anus and rectum: Secondary | ICD-10-CM

## 2018-04-25 DIAGNOSIS — K648 Other hemorrhoids: Secondary | ICD-10-CM | POA: Diagnosis not present

## 2018-04-25 MED ORDER — OMEPRAZOLE 20 MG PO CPDR: 20.0000 mg | DELAYED_RELEASE_CAPSULE | Freq: Every day | ORAL | 3 refills | Status: DC

## 2018-04-25 MED ORDER — PEG-KCL-NACL-NASULF-NA ASC-C 140 G PO SOLR: 1.0000 | Freq: Once | ORAL | 0 refills | Status: AC

## 2018-04-25 NOTE — Progress Notes (Signed)
JANNATUL WOJDYLA    [id number]    15-Jan-1971  Primary Care Physician:Greene, Ranell Patrick, MD  Referring Physician: Wendie Agreste, MD [address] [place], [place] [address]  Chief complaint: Abdominal pain, rectal bleeding  HPI: 48 year old female here for new consult visit with complaints of left upper quadrant abdominal pain since October 2019. She has constant left side upper abd pain. Denies any relation with diet or activity.  She was taking NSAID's frequently before, ibuprofen twice a week or more.  She quit taking NSAIDs in the past few months. She has also noticed increased amount of bright red blood per rectum when she wipes, covering the stool or in the toilet bowl with increased frequency in the past few months. Denies any weight loss or loss of appetite Her mother diagnosed with ulcerative colitis 5 years ago and has intermittent flares requiring steroids. Denies any family history of Crohn's disease or colon cancer.   CT abdomen and pelvis January 31, 2018 for evaluation of left-sided abdominal pain showed minimal left-sided colonic diverticulosis without evidence of diverticulitis and mild multilevel lumbar spine sclerosis and endplate irregularity.  Small mesenteric fat-containing periumbilical hernia.  Status post cholecystectomy with normal intra-and extrahepatic ducts.  Outpatient Encounter Medications as of 04/25/2018  Medication Sig  . FLUoxetine (PROZAC) 40 MG capsule Take 1 capsule (40 mg total) by mouth daily.  . norethindrone-ethinyl estradiol (GILDESS 1/20) 1-20 MG-MCG tablet Take 1 tablet by mouth daily. Continuous use because of Dysmenorrhea  . traZODone (DESYREL) 50 MG tablet Take 1 tablet (50 mg total) by mouth at bedtime.   No facility-administered encounter medications on file as of 04/25/2018.     Allergies as of 04/25/2018 - Review Complete 03/07/2018  Allergen Reaction Noted  . Pravastatin Other (See Comments) 01/27/2016    Past  Medical History:  Diagnosis Date  . Cholecystitis with cholelithiasis 03/15/2015  . Depression   . Dyspepsia and disorder of function of stomach 01/16/2014   Mild reflux symptoms, effectively reduced with Prilosec.   Marland Kitchen Gestational diabetes 2010   2nd of 2 pregnancies  . Hyperlipidemia   . Obesity   . Pre-eclampsia 2004  . Pre-eclampsia 2004   1st of 2 pregnancies    Past Surgical History:  Procedure Laterality Date  . LAPAROSCOPIC CHOLECYSTECTOMY    . TUBAL LIGATION    . WISDOM TOOTH EXTRACTION      Family History  Problem Relation Age of Onset  . Cancer Mother        skin cancer  . Hypertension Mother   . Deep vein thrombosis Father   . Cancer Father        prostate  . Hypertension Father   . Pulmonary embolism Father   . Alcohol abuse Sister     Social History   Socioeconomic History  . Marital status: Married    Spouse name: Shanon Brow  . Number of children: 2  . Years of education: 54  . Highest education level: Not on file  Occupational History  . Occupation: Programmer, multimedia: [employer]  Social Needs  . Financial resource strain: Not on file  . Food insecurity:    Worry: Not on file    Inability: Not on file  . Transportation needs:    Medical: Not on file    Non-medical: Not on file  Tobacco Use  . Smoking status: Never Smoker  . Smokeless tobacco: Never Used  Substance  and Sexual Activity  . Alcohol use: Yes    Alcohol/week: 0.0 - 1.0 standard drinks    Comment: once a week   . Drug use: No  . Sexual activity: Yes    Partners: Male    Birth control/protection: Pill    Comment: 1st intercourse- 16, partners- 30, married- 18 yrs   Lifestyle  . Physical activity:    Days per week: Not on file    Minutes per session: Not on file  . Stress: Not on file  Relationships  . Social connections:    Talks on phone: Not on file    Gets together: Not on file    Attends religious service: Not on file    Active member of club or  organization: Not on file    Attends meetings of clubs or organizations: Not on file    Relationship status: Not on file  . Intimate partner violence:    Fear of current or ex partner: Not on file    Emotionally abused: Not on file    Physically abused: Not on file    Forced sexual activity: Not on file  Other Topics Concern  . Not on file  Social History Narrative   Lives with husband and 2 children.      Review of systems: Review of Systems  Constitutional: Negative for fever and chills.  HENT: Negative.   Eyes: Negative for blurred vision.  Respiratory: Negative for cough, shortness of breath and wheezing.   Cardiovascular: Negative for chest pain and palpitations.  Gastrointestinal: as per HPI Genitourinary: Negative for dysuria, urgency, frequency and hematuria.  Musculoskeletal: Negative for myalgias, back pain and joint pain.  Skin: Negative for itching and rash.  Neurological: Negative for dizziness, tremors, focal weakness, seizures and loss of consciousness.  Endo/Heme/Allergies: Negative for seasonal allergies.  Psychiatric/Behavioral: Negative for depression, suicidal ideas and hallucinations.  All other systems reviewed and are negative.   Physical Exam: Vitals:   04/25/18 1321  BP: 130/77  Pulse: 100  SpO2: 97%   Body mass index is 29.13 kg/m. Gen:      No acute distress HEENT:  EOMI, sclera anicteric Neck:     No masses; no thyromegaly Lungs:    Clear to auscultation bilaterally; normal respiratory effort CV:         Regular rate and rhythm; no murmurs Abd:      + bowel sounds; soft, non-tender; no palpable masses, no distension Ext:    No edema; adequate peripheral perfusion Skin:      Warm and dry; no rash Neuro: alert and oriented x 3 Psych: normal mood and affect  Data Reviewed:  Reviewed labs, radiology imaging, old records and pertinent past GI work up   Assessment and Plan/Recommendations:  48 year old female status post  cholecystectomy with family history of ulcerative colitis and NSAID use here with complaints of left upper quadrant abdominal pain We will schedule for EGD and colonoscopy Differential includes IBD, Crohn's disease, peptic ulcer, NSAID induced gastrointestinal ulcer or hemorrhoidal hemorrhage Advised patient to avoid NSAIDs Start omeprazole 20 mg daily Further management based on endoscopic findings The risks and benefits as well as alternatives of endoscopic procedure(s) have been discussed and reviewed. All questions answered. The patient agrees to proceed.    Damaris Hippo , MD [phone]    CC: Wendie Agreste, MD

## 2018-04-25 NOTE — Patient Instructions (Addendum)
You have been scheduled for an endoscopy and colonoscopy. Please follow the written instructions given to you at your visit today. Please pick up your prep supplies at the pharmacy within the next 1-3 days. If you use inhalers (even only as needed), please bring them with you on the day of your procedure.   We have sent omeprazole to your pharmacy  Follow up as needed  If you are age 49 or older, your body mass index should be between 23-30. Your Body mass index is 29.13 kg/m. If this is out of the aforementioned range listed, please consider follow up with your Primary Care Provider.  If you are age 91 or younger, your body mass index should be between 19-25. Your Body mass index is 29.13 kg/m. If this is out of the aformentioned range listed, please consider follow up with your Primary Care Provider.    Thank you for choosing [facility]  Karleen Hampshire Nandigam,MD

## 2018-04-27 ENCOUNTER — Encounter: Payer: Self-pay | Admitting: Gastroenterology

## 2018-05-03 ENCOUNTER — Encounter: Payer: Self-pay | Admitting: Gastroenterology

## 2018-05-03 ENCOUNTER — Ambulatory Visit (AMBULATORY_SURGERY_CENTER): Payer: 59 | Admitting: Gastroenterology

## 2018-05-03 VITALS — BP 116/69 | HR 73 | Temp 98.7°F | Resp 16 | Ht 67.0 in | Wt 186.0 lb

## 2018-05-03 DIAGNOSIS — K625 Hemorrhage of anus and rectum: Secondary | ICD-10-CM

## 2018-05-03 DIAGNOSIS — K3189 Other diseases of stomach and duodenum: Secondary | ICD-10-CM | POA: Diagnosis not present

## 2018-05-03 DIAGNOSIS — K2950 Unspecified chronic gastritis without bleeding: Secondary | ICD-10-CM | POA: Diagnosis not present

## 2018-05-03 DIAGNOSIS — K2970 Gastritis, unspecified, without bleeding: Secondary | ICD-10-CM | POA: Diagnosis not present

## 2018-05-03 DIAGNOSIS — D124 Benign neoplasm of descending colon: Secondary | ICD-10-CM

## 2018-05-03 DIAGNOSIS — K5730 Diverticulosis of large intestine without perforation or abscess without bleeding: Secondary | ICD-10-CM

## 2018-05-03 DIAGNOSIS — K635 Polyp of colon: Secondary | ICD-10-CM

## 2018-05-03 DIAGNOSIS — K649 Unspecified hemorrhoids: Secondary | ICD-10-CM

## 2018-05-03 DIAGNOSIS — R1012 Left upper quadrant pain: Secondary | ICD-10-CM

## 2018-05-03 MED ORDER — SODIUM CHLORIDE 0.9 % IV SOLN: 500.0000 mL | Freq: Once | INTRAVENOUS | Status: DC

## 2018-05-03 NOTE — Patient Instructions (Signed)
Thank you for allowing [place] to care for you today!  Await pathology results by mail, approximately 2 weeks.  Recommendation for next colonoscopy will be made at that time.  Resume previous diet and medications today.  Return to normal activities tomorrow.  Handouts given for polyps, diverticulosis, and hemorrhoids.     YOU HAD AN ENDOSCOPIC PROCEDURE TODAY AT [facility]:   Refer to the procedure report that was given to you for any specific questions about what was found during the examination.  If the procedure report does not answer your questions, please call your gastroenterologist to clarify.  If you requested that your care partner not be given the details of your procedure findings, then the procedure report has been included in a sealed envelope for you to review at your convenience later.  YOU SHOULD EXPECT: Some feelings of bloating in the abdomen. Passage of more gas than usual.  Walking can help get rid of the air that was put into your GI tract during the procedure and reduce the bloating. If you had a lower endoscopy (such as a colonoscopy or flexible sigmoidoscopy) you may notice spotting of blood in your stool or on the toilet paper. If you underwent a bowel prep for your procedure, you may not have a normal bowel movement for a few days.  Please Note:  You might notice some irritation and congestion in your nose or some drainage.  This is from the oxygen used during your procedure.  There is no need for concern and it should clear up in a day or so.  SYMPTOMS TO REPORT IMMEDIATELY:   Following lower endoscopy (colonoscopy or flexible sigmoidoscopy):  Excessive amounts of blood in the stool  Significant tenderness or worsening of abdominal pains  Swelling of the abdomen that is new, acute  Fever of 100F or higher   Following upper endoscopy (EGD)  Vomiting of blood or coffee ground material  New chest pain or pain under the shoulder blades  Painful or  persistently difficult swallowing  New shortness of breath  Fever of 100F or higher  Black, tarry-looking stools  For urgent or emergent issues, a gastroenterologist can be reached at any hour by calling [phone].   DIET:  We do recommend a small meal at first, but then you may proceed to your regular diet.  Drink plenty of fluids but you should avoid alcoholic beverages for 24 hours.  ACTIVITY:  You should plan to take it easy for the rest of today and you should NOT DRIVE or use heavy machinery until tomorrow (because of the sedation medicines used during the test).    FOLLOW UP: Our staff will call the number listed on your records the next business day following your procedure to check on you and address any questions or concerns that you may have regarding the information given to you following your procedure. If we do not reach you, we will leave a message.  However, if you are feeling well and you are not experiencing any problems, there is no need to return our call.  We will assume that you have returned to your regular daily activities without incident.  If any biopsies were taken you will be contacted by phone or by letter within the next 1-3 weeks.  Please call us at [phone] if you have not heard about the biopsies in 3 weeks.    SIGNATURES/CONFIDENTIALITY: You and/or your care partner have signed paperwork which will be entered into  your electronic medical record.  These signatures attest to the fact that that the information above on your After Visit Summary has been reviewed and is understood.  Full responsibility of the confidentiality of this discharge information lies with you and/or your care-partner.

## 2018-05-03 NOTE — Progress Notes (Signed)
Called to room to assist during endoscopic procedure.  Patient ID and intended procedure confirmed with present staff. Received instructions for my participation in the procedure from the performing physician.  

## 2018-05-03 NOTE — Op Note (Addendum)
[facility] Patient Name: Laurie Bond Procedure Date: 05/03/2018 11:24 AM MRN: [id number] Endoscopist: Mauri Pole , MD Age: 48 Referring MD:  Date of Birth: April 07, 1971 Gender: Female Account #: [id number] Procedure:                Upper GI endoscopy Indications:              Epigastric abdominal pain Medicines:                Monitored Anesthesia Care Procedure:                Pre-Anesthesia Assessment:                           - Prior to the procedure, a History and Physical                            was performed, and patient medications and                            allergies were reviewed. The patient's tolerance of                            previous anesthesia was also reviewed. The risks                            and benefits of the procedure and the sedation                            options and risks were discussed with the patient.                            All questions were answered, and informed consent                            was obtained. Prior Anticoagulants: The patient has                            taken no previous anticoagulant or antiplatelet                            agents. ASA Grade Assessment: II - A patient with                            mild systemic disease. After reviewing the risks                            and benefits, the patient was deemed in                            satisfactory condition to undergo the procedure.                           After obtaining informed consent, the endoscope was  passed under direct vision. Throughout the                            procedure, the patient's blood pressure, pulse, and                            oxygen saturations were monitored continuously. The                            Endoscope was introduced through the mouth, and                            advanced to the second part of duodenum. The upper                            GI endoscopy was  accomplished without difficulty.                            The patient tolerated the procedure well. Scope In: Scope Out: Findings:                 The esophagus was normal.                           The Z-line was regular and was found 38 cm from the                            incisors.                           Patchy mild inflammation characterized by                            congestion (edema) and erythema was found in the                            entire examined stomach. Biopsies were taken with a                            cold forceps for Helicobacter pylori testing.                           The examined duodenum was normal. Complications:            No immediate complications. Estimated blood loss:                            None. Estimated Blood Loss:     Estimated blood loss was minimal. Impression:               - Normal esophagus.                           - Z-line regular, 38 cm from the incisors.                           -  Gastritis. Biopsied.                           - Normal examined duodenum. Recommendation:           - Patient has a contact number available for                            emergencies. The signs and symptoms of potential                            delayed complications were discussed with the                            patient. Return to normal activities tomorrow.                            Written discharge instructions were provided to the                            patient.                           - Resume previous diet.                           - Continue present medications.                           - Await pathology results.                           - Return to my office PRN. Mauri Pole, MD 05/03/2018 12:02:44 PM This report has been signed electronically.

## 2018-05-03 NOTE — Progress Notes (Signed)
To PACU, VSS. Report to [email or web address] 

## 2018-05-03 NOTE — Op Note (Signed)
[facility] Patient Name: Laurie Bond Procedure Date: 05/03/2018 11:23 AM MRN: [id number] Endoscopist: Mauri Pole , MD Age: 48 Referring MD:  Date of Birth: 1971-01-02 Gender: Female Account #: [id number] Procedure:                Colonoscopy Indications:              Evaluation of unexplained GI bleeding. Bright red                            blood per rectum. Medicines:                Monitored Anesthesia Care Procedure:                Pre-Anesthesia Assessment:                           - Prior to the procedure, a History and Physical                            was performed, and patient medications and                            allergies were reviewed. The patient's tolerance of                            previous anesthesia was also reviewed. The risks                            and benefits of the procedure and the sedation                            options and risks were discussed with the patient.                            All questions were answered, and informed consent                            was obtained. Prior Anticoagulants: The patient has                            taken no previous anticoagulant or antiplatelet                            agents. ASA Grade Assessment: II - A patient with                            mild systemic disease. After reviewing the risks                            and benefits, the patient was deemed in                            satisfactory condition to undergo the procedure.  After obtaining informed consent, the colonoscope                            was passed under direct vision. Throughout the                            procedure, the patient's blood pressure, pulse, and                            oxygen saturations were monitored continuously. The                            Model PCF-[id number]) scope was introduced                            through the anus and advanced to  the the cecum,                            identified by appendiceal orifice and ileocecal                            valve. The colonoscopy was performed without                            difficulty. The patient tolerated the procedure                            well. The quality of the bowel preparation was                            excellent. The ileocecal valve, appendiceal                            orifice, and rectum were photographed. Scope In: 11:35:39 AM Scope Out: 11:50:28 AM Scope Withdrawal Time: 0 hours 11 minutes 4 seconds  Total Procedure Duration: 0 hours 14 minutes 49 seconds  Findings:                 The perianal and digital rectal examinations were                            normal.                           A 5 mm polyp was found in the descending colon. The                            polyp was sessile. The polyp was removed with a                            cold snare. Resection and retrieval were complete.                           Multiple small and large-mouthed diverticula were  found in the sigmoid colon and descending colon.                           Non-bleeding internal hemorrhoids were found during                            retroflexion. The hemorrhoids were small. Complications:            No immediate complications. Estimated Blood Loss:     Estimated blood loss was minimal. Impression:               - One 5 mm polyp in the descending colon, removed                            with a cold snare. Resected and retrieved.                           - Mild diverticulosis in the sigmoid colon and in                            the descending colon.                           - Non-bleeding internal hemorrhoids. Recommendation:           - Patient has a contact number available for                            emergencies. The signs and symptoms of potential                            delayed complications were discussed with the                             patient. Return to normal activities tomorrow.                            Written discharge instructions were provided to the                            patient.                           - Resume previous diet.                           - Continue present medications.                           - Await pathology results.                           - Repeat colonoscopy in 5-10 years for surveillance                            based on pathology results. Mauri Pole, MD 05/03/2018 12:05:28 PM This report has  been signed electronically.

## 2018-05-04 ENCOUNTER — Telehealth: Payer: Self-pay

## 2018-05-04 NOTE — Telephone Encounter (Signed)
  Follow up Call-  Call back number 05/03/2018  Post procedure Call Back phone  # [phone]  Permission to leave phone message Yes  Some recent data might be hidden     Patient questions:  Do you have a fever, pain , or abdominal swelling? No. Pain Score  0 *  Have you tolerated food without any problems? Yes.    Have you been able to return to your normal activities? Yes.    Do you have any questions about your discharge instructions: Diet   No. Medications  No. Follow up visit  No.  Do you have questions or concerns about your Care? No.  Actions: * If pain score is 4 or above: No action needed, pain <4.

## 2018-05-09 ENCOUNTER — Encounter: Payer: Self-pay | Admitting: Gastroenterology

## 2018-09-12 ENCOUNTER — Other Ambulatory Visit: Payer: Self-pay

## 2018-09-13 ENCOUNTER — Ambulatory Visit: Payer: 59 | Admitting: Obstetrics & Gynecology

## 2018-09-13 ENCOUNTER — Encounter: Payer: Self-pay | Admitting: Obstetrics & Gynecology

## 2018-09-13 VITALS — BP 128/72 | Ht 67.0 in | Wt 193.0 lb

## 2018-09-13 DIAGNOSIS — Z01419 Encounter for gynecological examination (general) (routine) without abnormal findings: Secondary | ICD-10-CM

## 2018-09-13 DIAGNOSIS — E6609 Other obesity due to excess calories: Secondary | ICD-10-CM

## 2018-09-13 DIAGNOSIS — Z3041 Encounter for surveillance of contraceptive pills: Secondary | ICD-10-CM | POA: Diagnosis not present

## 2018-09-13 DIAGNOSIS — Z6830 Body mass index (BMI) 30.0-30.9, adult: Secondary | ICD-10-CM

## 2018-09-13 DIAGNOSIS — E66811 Obesity, class 1: Secondary | ICD-10-CM

## 2018-09-13 DIAGNOSIS — Z9851 Tubal ligation status: Secondary | ICD-10-CM

## 2018-09-13 MED ORDER — NORETHINDRONE ACET-ETHINYL EST 1-20 MG-MCG PO TABS: 1.0000 | ORAL_TABLET | Freq: Every day | ORAL | 4 refills | Status: DC

## 2018-09-13 NOTE — Progress Notes (Signed)
Laurie Bond [birth year] [id number]   History:    48 y.o. G2P2L2 Married.  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  RP:  Established patient presenting for annual gyn exam   HPI:  Well on continuous BCPs.  No BTB.  No pelvic pain.  No pain with IC.  Normal vaginal secretions.  Urine/BMs normal.  Breasts normal.  BMI 30.23.  Planning to start exercising more with stationary bike to be delivered this week to her house.  Health Labs with Fam MD.  Past medical history,surgical history, family history and social history were all reviewed and documented in the EPIC chart.  Gynecologic History No LMP recorded. (Menstrual status: Oral contraceptives). Contraception: tubal ligation Last Pap: 07/2016. Results were: Negative/HPV HR neg Last mammogram: 07/2017. Results were: Negative Bone Density: Never Colonoscopy: 2020  Obstetric History OB History  Gravida Para Term Preterm AB Living  2 2       2   SAB TAB Ectopic Multiple Live Births               # Outcome Date GA Lbr Len/2nd Weight Sex Delivery Anes PTL Lv  2 Para           1 Para              ROS: A ROS was performed and pertinent positives and negatives are included in the history.  GENERAL: No fevers or chills. HEENT: No change in vision, no earache, sore throat or sinus congestion. NECK: No pain or stiffness. CARDIOVASCULAR: No chest pain or pressure. No palpitations. PULMONARY: No shortness of breath, cough or wheeze. GASTROINTESTINAL: No abdominal pain, nausea, vomiting or diarrhea, melena or bright red blood per rectum. GENITOURINARY: No urinary frequency, urgency, hesitancy or dysuria. MUSCULOSKELETAL: No joint or muscle pain, no back pain, no recent trauma. DERMATOLOGIC: No rash, no itching, no lesions. ENDOCRINE: No polyuria, polydipsia, no heat or cold intolerance. No recent change in weight. HEMATOLOGICAL: No anemia or easy bruising or bleeding. NEUROLOGIC: No headache, seizures,  numbness, tingling or weakness. PSYCHIATRIC: No depression, no loss of interest in normal activity or change in sleep pattern.     Exam:   BP 128/72   Ht 5\' 7"  (1.702 m)   Wt 193 lb (87.5 kg)   BMI 30.23 kg/m   Body mass index is 30.23 kg/m.  General appearance : Well developed well nourished female. No acute distress HEENT: Eyes: no retinal hemorrhage or exudates,  Neck supple, trachea midline, no carotid bruits, no thyroidmegaly Lungs: Clear to auscultation, no rhonchi or wheezes, or rib retractions  Heart: Regular rate and rhythm, no murmurs or gallops Breast:Examined in sitting and supine position were symmetrical in appearance, no palpable masses or tenderness,  no skin retraction, no nipple inversion, no nipple discharge, no skin discoloration, no axillary or supraclavicular lymphadenopathy Abdomen: no palpable masses or tenderness, no rebound or guarding Extremities: no edema or skin discoloration or tenderness  Pelvic: Vulva: Normal             Vagina: No gross lesions or discharge  Cervix: No gross lesions or discharge  Uterus  AV, normal size, shape and consistency, non-tender and mobile  Adnexa  Without masses or tenderness  Anus: Normal   Assessment/Plan:  48 y.o. female for annual exam   1. Well female exam with routine gynecological exam Normal gynecologic exam.  Pap test in April 2018 was negative with negative high-risk HPV, no indication to  repeat this year, will repeat a Pap test next year.  Breast exam normal.  Screening mammogram April 2019 was negative, will schedule a screening mammogram now.  Colonoscopy done in 2020.  Health labs with family physician.  2. S/P tubal ligation  3. Encounter for surveillance of contraceptive pills Status post tubal ligation but desires to continue with birth control pills continuously for cycle control.  No contraindication to birth control pills.  Gildess 1/20 represcribed.  4. Class 1 obesity due to excess calories  without serious comorbidity with body mass index (BMI) of 30.0 to 30.9 in adult Recommend a slightly lower calorie/carb nutrition.  Recommend aerobic physical activities 5 times a week and weightlifting every 2 days.  Other orders - norethindrone-ethinyl estradiol (GILDESS 1/20) 1-20 MG-MCG tablet; Take 1 tablet by mouth daily. Continuous use because of Dysmenorrhea  Princess Bruins MD, 3:31 PM 09/13/2018

## 2018-09-15 ENCOUNTER — Other Ambulatory Visit: Payer: Self-pay | Admitting: Obstetrics & Gynecology

## 2018-09-15 ENCOUNTER — Encounter: Payer: Self-pay | Admitting: Obstetrics & Gynecology

## 2018-09-15 NOTE — Patient Instructions (Signed)
1. Well female exam with routine gynecological exam Normal gynecologic exam.  Pap test in April 2018 was negative with negative high-risk HPV, no indication to repeat this year, will repeat a Pap test next year.  Breast exam normal.  Screening mammogram April 2019 was negative, will schedule a screening mammogram now.  Colonoscopy done in 2020.  Health labs with family physician.  2. S/P tubal ligation  3. Encounter for surveillance of contraceptive pills Status post tubal ligation but desires to continue with birth control pills continuously for cycle control.  No contraindication to birth control pills.  Gildess 1/20 represcribed.  4. Class 1 obesity due to excess calories without serious comorbidity with body mass index (BMI) of 30.0 to 30.9 in adult Recommend a slightly lower calorie/carb nutrition.  Recommend aerobic physical activities 5 times a week and weightlifting every 2 days.  Other orders - norethindrone-ethinyl estradiol (GILDESS 1/20) 1-20 MG-MCG tablet; Take 1 tablet by mouth daily. Continuous use because of Dysmenorrhea  Emilia, it was a pleasure seeing you today!

## 2018-09-19 ENCOUNTER — Other Ambulatory Visit: Payer: Self-pay | Admitting: Obstetrics & Gynecology

## 2018-09-19 DIAGNOSIS — Z1231 Encounter for screening mammogram for malignant neoplasm of breast: Secondary | ICD-10-CM

## 2018-10-30 ENCOUNTER — Ambulatory Visit (INDEPENDENT_AMBULATORY_CARE_PROVIDER_SITE_OTHER): Payer: 59

## 2018-10-30 ENCOUNTER — Ambulatory Visit: Payer: 59 | Admitting: Family Medicine

## 2018-10-30 ENCOUNTER — Encounter: Payer: Self-pay | Admitting: Family Medicine

## 2018-10-30 ENCOUNTER — Other Ambulatory Visit: Payer: Self-pay

## 2018-10-30 VITALS — BP 139/82 | HR 92 | Temp 98.4°F | Resp 14 | Wt 195.6 lb

## 2018-10-30 DIAGNOSIS — S99922A Unspecified injury of left foot, initial encounter: Secondary | ICD-10-CM

## 2018-10-30 DIAGNOSIS — F439 Reaction to severe stress, unspecified: Secondary | ICD-10-CM | POA: Diagnosis not present

## 2018-10-30 DIAGNOSIS — F32A Depression, unspecified: Secondary | ICD-10-CM

## 2018-10-30 DIAGNOSIS — F329 Major depressive disorder, single episode, unspecified: Secondary | ICD-10-CM | POA: Diagnosis not present

## 2018-10-30 NOTE — Patient Instructions (Addendum)
See info on stress and stress management to see if there may be other ways to treat at  Try other form of exercise such as yoga, core work that does not involve sore foot at this time. . Meeting with therapist may also help. Here is an option if not going through [employer] at work. : [facility]: [phone]  No fractures on xray. See info on contusion below. Tylenol as needed. Recheck if not improving in next 3-4 weeks.   Send me a mychart update in next 3-4 weeks, but am happy to see you sooner if needed.    Contusion A contusion is a deep bruise. Contusions are the result of a blunt injury to tissues and muscle fibers under the skin. The injury causes bleeding under the skin. The skin overlying the contusion may turn blue, purple, or yellow. Minor injuries will give you a painless contusion, but more severe injuries cause contusions that may stay painful and swollen for a few weeks. Follow these instructions at home: Pay attention to any changes in your symptoms. Let your health care provider know about them. Take these actions to relieve your pain. Managing pain, stiffness, and swelling   Use resting, icing, applying pressure (compression), and raising (elevating) the injured area. This is often called the RICE strategy. ? Rest the injured area. Return to your normal activities as told by your health care provider. Ask your health care provider what activities are safe for you. ? If directed, put ice on the injured area:  Put ice in a plastic bag.  Place a towel between your skin and the bag.  Leave the ice on for 20 minutes, 2-3 times per day. ? If directed, apply light compression to the injured area using an elastic bandage. Make sure the bandage is not wrapped too tightly. Remove and reapply the bandage as directed by your health care provider. ? If possible, raise (elevate) the injured area above the level of your heart while you are sitting or lying down. General  instructions  Take over-the-counter and prescription medicines only as told by your health care provider.  Keep all follow-up visits as told by your health care provider. This is important. Contact a health care provider if:  Your symptoms do not improve after several days of treatment.  Your symptoms get worse.  You have difficulty moving the injured area. Get help right away if:  You have severe pain.  You have numbness in a hand or foot.  Your hand or foot turns pale or cold. Summary  A contusion is a deep bruise.  Contusions are the result of a blunt injury to tissues and muscle fibers under the skin.  It is treated with rest, ice, compression, and elevation. You may be given over-the-counter medicines for pain.  Contact a health care provider if your symptoms do not improve, or get worse.  Get help right away if you have severe pain, have numbness, or the area turns pale or cold. This information is not intended to replace advice given to you by your health care provider. Make sure you discuss any questions you have with your health care provider. Document Released: 01/13/2005 Document Revised: 11/24/2017 Document Reviewed: 11/24/2017 Elsevier Patient Education  2020 [employer] is a normal reaction to life events. Stress is what you feel when life demands more than you are used to, or more than you think you can handle. Some stress can be useful, such as studying for a  test or meeting a deadline at work. Stress that occurs too often or for too long can cause problems. It can affect your emotional health and interfere with relationships and normal daily activities. Too much stress can weaken your body's defense system (immune system) and increase your risk for physical illness. If you already have a medical problem, stress can make it worse. What are the causes? All sorts of life events can cause stress. An event that causes stress for one person may not be  stressful for another person. Major life events, whether positive or negative, commonly cause stress. Examples include:  Losing a job or starting a new job.  Losing a loved one.  Moving to a new town or home.  Getting married or divorced.  Having a baby.  Injury or illness. Less obvious life events can also cause stress, especially if they occur day after day or in combination with each other. Examples include:  Working long hours.  Driving in traffic.  Caring for children.  Being in debt.  Being in a difficult relationship. What are the signs or symptoms? Stress can cause emotional symptoms, including:  Anxiety. This is feeling worried, afraid, on edge, overwhelmed, or out of control.  Anger, including irritation or impatience.  Depression. This is feeling sad, down, helpless, or guilty.  Trouble focusing, remembering, or making decisions. Stress can cause physical symptoms, including:  Aches and pains. These may affect your head, neck, back, stomach, or other areas of your body.  Tight muscles or a clenched jaw.  Low energy.  Trouble sleeping. Stress can cause unhealthy behaviors, including:  Eating to feel better (overeating) or skipping meals.  Working too much or putting off tasks.  Smoking, drinking alcohol, or using drugs to feel better. How is this diagnosed? Stress is diagnosed through an assessment by your health care provider. He or she may diagnose this condition based on:  Your symptoms and any stressful life events.  Your medical history.  Tests to rule out other causes of your symptoms. Depending on your condition, your health care provider may refer you to a specialist for further evaluation. How is this treated?  Stress management techniques are the recommended treatment for stress. Medicine is not typically recommended for the treatment of stress. Techniques to reduce your reaction to stressful life events include:  Stress  identification. Monitor yourself for symptoms of stress and identify what causes stress for you. These skills may help you to avoid or prepare for stressful events.  Time management. Set your priorities, keep a calendar of events, and learn to say "no." Taking these actions can help you avoid making too many commitments. Techniques for coping with stress include:  Rethinking the problem. Try to think realistically about stressful events rather than ignoring them or overreacting. Try to find the positives in a stressful situation rather than focusing on the negatives.  Exercise. Physical exercise can release both physical and emotional tension. The key is to find a form of exercise that you enjoy and do it regularly.  Relaxation techniques. These relax the body and mind. The key is to find one or more that you enjoy and use the technique(s) regularly. Examples include: ? Meditation, deep breathing, or progressive relaxation techniques. ? Yoga or tai chi. ? Biofeedback, mindfulness techniques, or journaling. ? Listening to music, being out in nature, or participating in other hobbies.  Practicing a healthy lifestyle. Eat a balanced diet, drink plenty of water, limit or avoid caffeine, and get plenty of  sleep.  Having a strong support network. Spend time with family, friends, or other people you enjoy being around. Express your feelings and talk things over with someone you trust. Counseling or talk therapy with a mental health professional may be helpful if you are having trouble managing stress on your own. Follow these instructions at home: Lifestyle   Avoid drugs.  Do not use any products that contain nicotine or tobacco, such as cigarettes and e-cigarettes. If you need help quitting, ask your health care provider.  Limit alcohol intake to no more than 1 drink a day for nonpregnant women and 2 drinks a day for men. One drink equals 12 oz of beer, 5 oz of wine, or 1 oz of hard  liquor.  Do not use alcohol or drugs to relax.  Eat a balanced diet that includes fresh fruits and vegetables, whole grains, lean meats, fish, eggs, and beans, and low-fat dairy. Avoid processed foods and foods high in added fat, sugar, and salt.  Exercise at least 30 minutes on 5 or more days each week.  Get 7-8 hours of sleep each night. General instructions   Practice stress management techniques as discussed with your health care provider.  Drink enough fluid to keep your urine clear or pale yellow.  Take over-the-counter and prescription medicines only as told by your health care provider.  Keep all follow-up visits as told by your health care provider. This is important. Contact a health care provider if:  Your symptoms get worse.  You have new symptoms.  You feel overwhelmed by your problems and can no longer manage them on your own. Get help right away if:  You have thoughts of hurting yourself or others. If you ever feel like you may hurt yourself or others, or have thoughts about taking your own life, get help right away. You can go to your nearest emergency department or call:  Your local emergency services (911 in the [place]  A suicide crisis helpline, such as the [facility] at (414)414-7899. This is open 24 hours a day. Summary  Stress is a normal reaction to life events. It can cause problems if it happens too often or for too long.  Practicing stress management techniques is the best way to treat stress.  Counseling or talk therapy with a mental health professional may be helpful if you are having trouble managing stress on your own. This information is not intended to replace advice given to you by your health care provider. Make sure you discuss any questions you have with your health care provider. Document Released: 09/29/2000 Document Revised: 03/18/2017 Document Reviewed: 05/26/2016 Elsevier Patient Education  [employer].    If you have lab work done today you will be contacted with your lab results within the next 2 weeks.  If you have not heard from [place] then please contact us. The fastest way to get your results is to register for My Chart.   IF you received an x-ray today, you will receive an invoice from [facility]. Please contact [facility] at [phone] with questions or concerns regarding your invoice.   IF you received labwork today, you will receive an invoice from [facility]. Please contact [facility] at [phone] with questions or concerns regarding your invoice.   Our billing staff will not be able to assist you with questions regarding bills from these companies.  You will be contacted with the lab results as soon as they are available. The fastest  way to get your results is to activate your My Chart account. Instructions are located on the last page of this paperwork. If you have not heard from [place] regarding the results in 2 weeks, please contact this office.

## 2018-10-30 NOTE — Progress Notes (Signed)
Subjective:    Patient ID: Laurie Bond, female    DOB: 03-22-1971, 48 y.o.   MRN: [id number]  HPI Laurie Bond is a 48 y.o. female Presents today for: Chief Complaint  Patient presents with  . Foot Pain    3 wk ago ran over left foot with a shopping cart. Hard to walk on foot for a long peroid of time. At night foot throb  . Medication Refill    need a refill on prozac abd trazodone  . Anxiety    Would like to talk about going on a med for anxiety Gad 7= #7    L foot pain: Approximately 3 weeks ago went over left foot with a shopping cart. Putting groceries away- full cart, rolled over lateral left foot and last few toes.  Some bruising, no swelling. Painful. No bleeding.  Initial trouble with WB, ice and elevation.  No prior eval.  No prior left foot fx.   more pain to walk more than 10-15 min at a a time now and sore at night. Some soreness with any walking.  Recent tx: ice QD, occasional ibuprofen, tylenol - some benefit.    Depression with insomnia: Discussed in November 2019.  Was taking Prozac 40 mg daily, trazodone 50 mg nightly at that time. Still on same regimen.  Reports some increased anxiety symptoms.   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 Change in workflow, anxiety about school and kids (50 and 9yo).  Tx: decreased caffeine - better sleep.  Has treadmill (gym closed), but limited as hurt foot.  No pool, no bicycle.  Rare alcohol - 1-2 per week. Online church.  No recent therapy - about 20 yrs ago. Depression at that time.    Depression screen [facility] 2/9 10/30/2018 10/30/2018 03/07/2018 01/26/2018 02/04/2017  Decreased Interest 0 0 0 0 0  Down, Depressed, Hopeless 1 0 0 0 0  PHQ - 2 Score 1 0 0 0 0   GAD 7 : Generalized Anxiety Score 10/30/2018  Nervous, Anxious, on Edge 1  Control/stop worrying 0  Worry too much - different things 2  Trouble relaxing 1  Restless 0  Easily annoyed or irritable 1   Afraid - awful might happen 2  Total GAD 7 Score 7  Anxiety Difficulty Not difficult at all         Patient Active Problem List   Diagnosis Date Noted  . Insomnia 01/27/2016  . Hyperlipidemia 03/07/2012  . Depression 03/07/2012  . Immune to varicella 03/07/2006   Past Medical History:  Diagnosis Date  . Cholecystitis with cholelithiasis 03/15/2015  . Depression   . Dyspepsia and disorder of function of stomach 01/16/2014   Mild reflux symptoms, effectively reduced with Prilosec.   Marland Kitchen Gestational diabetes 2010   2nd of 2 pregnancies  . Hyperlipidemia   . Obesity   . Pre-eclampsia 2004  . Pre-eclampsia 2004   1st of 2 pregnancies   Past Surgical History:  Procedure Laterality Date  . LAPAROSCOPIC CHOLECYSTECTOMY    . TUBAL LIGATION    . WISDOM TOOTH EXTRACTION     Allergies  Allergen Reactions  . Pravastatin Other (See Comments)    Severe myalgias   Prior to Admission medications   Medication Sig Start Date End Date Taking? Authorizing Provider  FLUoxetine (PROZAC) 40 MG capsule Take 1 capsule (40 mg total) by mouth daily. 01/26/18  Yes Wendie Agreste, MD  norethindrone-ethinyl estradiol (  GILDESS 1/20) 1-20 MG-MCG tablet Take 1 tablet by mouth daily. Continuous use because of Dysmenorrhea 09/13/18  Yes Princess Bruins, MD  omeprazole (PRILOSEC) 20 MG capsule Take 1 capsule (20 mg total) by mouth daily. 04/25/18  Yes Nandigam, Venia Minks, MD  traZODone (DESYREL) 50 MG tablet Take 1 tablet (50 mg total) by mouth at bedtime. 01/26/18  Yes Wendie Agreste, MD   Social History   Socioeconomic History  . Marital status: Married    Spouse name: Shanon Brow  . Number of children: 2  . Years of education: 75  . Highest education level: Not on file  Occupational History  . Occupation: Programmer, multimedia: [employer]  Social Needs  . Financial resource strain: Not on file  . Food insecurity    Worry: Not on file    Inability: Not on file  . Transportation  needs    Medical: Not on file    Non-medical: Not on file  Tobacco Use  . Smoking status: Never Smoker  . Smokeless tobacco: Never Used  Substance and Sexual Activity  . Alcohol use: Yes    Alcohol/week: 0.0 - 1.0 standard drinks    Comment: once a week   . Drug use: No  . Sexual activity: Yes    Partners: Male    Birth control/protection: Pill    Comment: 1st intercourse- 16, partners- 53, married- 4 yrs   Lifestyle  . Physical activity    Days per week: Not on file    Minutes per session: Not on file  . Stress: Not on file  Relationships  . Social Herbalist on phone: Not on file    Gets together: Not on file    Attends religious service: Not on file    Active member of club or organization: Not on file    Attends meetings of clubs or organizations: Not on file    Relationship status: Not on file  . Intimate partner violence    Fear of current or ex partner: Not on file    Emotionally abused: Not on file    Physically abused: Not on file    Forced sexual activity: Not on file  Other Topics Concern  . Not on file  Social History Narrative   Lives with husband and 2 children.    Review of Systems Per HPI.     Objective:   Physical Exam Constitutional:      General: She is not in acute distress.    Appearance: She is well-developed.  HENT:     Head: Normocephalic and atraumatic.  Cardiovascular:     Rate and Rhythm: Normal rate.  Pulmonary:     Effort: Pulmonary effort is normal.  Musculoskeletal:       Feet:  Neurological:     Mental Status: She is alert and oriented to person, place, and time.  Psychiatric:        Mood and Affect: Mood normal.        Behavior: Behavior normal.        Thought Content: Thought content normal.    There were no vitals filed for this visit.    Dg Foot 2 Views Left  Result Date: 10/30/2018 CLINICAL DATA:  Injury to the left foot.  Pain. EXAM: LEFT FOOT - 2 VIEW COMPARISON:  None. FINDINGS: No fracture or bone  lesion. Joints are normally spaced and aligned.  No arthropathic changes. Small to moderate-sized plantar calcaneal spur. Normal  soft tissues. IMPRESSION: 1. No fracture or acute finding. 2. Small to moderate plantar calcaneal spur.  No other abnormality. Electronically Signed   By: Lajean Manes M.D.   On: 10/30/2018 10:02        Assessment & Plan:   Laurie Bond is a 48 y.o. female Foot injury, left, initial encounter - Plan: DG Foot 2 Views Left  -No findings of fracture on x-ray, exam overall reassuring.  Suspected soft tissue versus bony contusion.  Anticipate continued improvement with symptomatic care, with RTC precautions given if not improving next few weeks.  Depression, unspecified depression type Situational stress  -Underlying depression with insomnia that has been previously controlled on current regimen.  Suspect situational stressors with pandemic and job change.  Counseling recommended, other stress management techniques per handout.  Follow-up in 3 to 4 weeks with my chart update, or in office visit if needed  No orders of the defined types were placed in this encounter.  Patient Instructions    See info on stress and stress management to see if there may be other ways to treat at  Try other form of exercise such as yoga, core work that does not involve sore foot at this time. . Meeting with therapist may also help. Here is an option if not going through [employer] at work. : [place]: [phone]  No fractures on xray. See info on contusion below. Tylenol as needed. Recheck if not improving in next 3-4 weeks.   Send me a mychart update in next 3-4 weeks, but am happy to see you sooner if needed.    Contusion A contusion is a deep bruise. Contusions are the result of a blunt injury to tissues and muscle fibers under the skin. The injury causes bleeding under the skin. The skin overlying the contusion may turn blue, purple, or yellow. Minor injuries  will give you a painless contusion, but more severe injuries cause contusions that may stay painful and swollen for a few weeks. Follow these instructions at home: Pay attention to any changes in your symptoms. Let your health care provider know about them. Take these actions to relieve your pain. Managing pain, stiffness, and swelling   Use resting, icing, applying pressure (compression), and raising (elevating) the injured area. This is often called the RICE strategy. ? Rest the injured area. Return to your normal activities as told by your health care provider. Ask your health care provider what activities are safe for you. ? If directed, put ice on the injured area:  Put ice in a plastic bag.  Place a towel between your skin and the bag.  Leave the ice on for 20 minutes, 2-3 times per day. ? If directed, apply light compression to the injured area using an elastic bandage. Make sure the bandage is not wrapped too tightly. Remove and reapply the bandage as directed by your health care provider. ? If possible, raise (elevate) the injured area above the level of your heart while you are sitting or lying down. General instructions  Take over-the-counter and prescription medicines only as told by your health care provider.  Keep all follow-up visits as told by your health care provider. This is important. Contact a health care provider if:  Your symptoms do not improve after several days of treatment.  Your symptoms get worse.  You have difficulty moving the injured area. Get help right away if:  You have severe pain.  You have numbness in a hand or foot.  Your hand or foot turns pale or cold. Summary  A contusion is a deep bruise.  Contusions are the result of a blunt injury to tissues and muscle fibers under the skin.  It is treated with rest, ice, compression, and elevation. You may be given over-the-counter medicines for pain.  Contact a health care provider if your  symptoms do not improve, or get worse.  Get help right away if you have severe pain, have numbness, or the area turns pale or cold. This information is not intended to replace advice given to you by your health care provider. Make sure you discuss any questions you have with your health care provider. Document Released: 01/13/2005 Document Revised: 11/24/2017 Document Reviewed: 11/24/2017 Elsevier Patient Education  2020 [place] is a normal reaction to life events. Stress is what you feel when life demands more than you are used to, or more than you think you can handle. Some stress can be useful, such as studying for a test or meeting a deadline at work. Stress that occurs too often or for too long can cause problems. It can affect your emotional health and interfere with relationships and normal daily activities. Too much stress can weaken your body's defense system (immune system) and increase your risk for physical illness. If you already have a medical problem, stress can make it worse. What are the causes? All sorts of life events can cause stress. An event that causes stress for one person may not be stressful for another person. Major life events, whether positive or negative, commonly cause stress. Examples include:  Losing a job or starting a new job.  Losing a loved one.  Moving to a new town or home.  Getting married or divorced.  Having a baby.  Injury or illness. Less obvious life events can also cause stress, especially if they occur day after day or in combination with each other. Examples include:  Working long hours.  Driving in traffic.  Caring for children.  Being in debt.  Being in a difficult relationship. What are the signs or symptoms? Stress can cause emotional symptoms, including:  Anxiety. This is feeling worried, afraid, on edge, overwhelmed, or out of control.  Anger, including irritation or impatience.  Depression. This is  feeling sad, down, helpless, or guilty.  Trouble focusing, remembering, or making decisions. Stress can cause physical symptoms, including:  Aches and pains. These may affect your head, neck, back, stomach, or other areas of your body.  Tight muscles or a clenched jaw.  Low energy.  Trouble sleeping. Stress can cause unhealthy behaviors, including:  Eating to feel better (overeating) or skipping meals.  Working too much or putting off tasks.  Smoking, drinking alcohol, or using drugs to feel better. How is this diagnosed? Stress is diagnosed through an assessment by your health care provider. He or she may diagnose this condition based on:  Your symptoms and any stressful life events.  Your medical history.  Tests to rule out other causes of your symptoms. Depending on your condition, your health care provider may refer you to a specialist for further evaluation. How is this treated?  Stress management techniques are the recommended treatment for stress. Medicine is not typically recommended for the treatment of stress. Techniques to reduce your reaction to stressful life events include:  Stress identification. Monitor yourself for symptoms of stress and identify what causes stress for you. These skills may help you to avoid or prepare for stressful events.  Time management. Set your priorities, keep a calendar of events, and learn to say "no." Taking these actions can help you avoid making too many commitments. Techniques for coping with stress include:  Rethinking the problem. Try to think realistically about stressful events rather than ignoring them or overreacting. Try to find the positives in a stressful situation rather than focusing on the negatives.  Exercise. Physical exercise can release both physical and emotional tension. The key is to find a form of exercise that you enjoy and do it regularly.  Relaxation techniques. These relax the body and mind. The key is to  find one or more that you enjoy and use the technique(s) regularly. Examples include: ? Meditation, deep breathing, or progressive relaxation techniques. ? Yoga or tai chi. ? Biofeedback, mindfulness techniques, or journaling. ? Listening to music, being out in nature, or participating in other hobbies.  Practicing a healthy lifestyle. Eat a balanced diet, drink plenty of water, limit or avoid caffeine, and get plenty of sleep.  Having a strong support network. Spend time with family, friends, or other people you enjoy being around. Express your feelings and talk things over with someone you trust. Counseling or talk therapy with a mental health professional may be helpful if you are having trouble managing stress on your own. Follow these instructions at home: Lifestyle   Avoid drugs.  Do not use any products that contain nicotine or tobacco, such as cigarettes and e-cigarettes. If you need help quitting, ask your health care provider.  Limit alcohol intake to no more than 1 drink a day for nonpregnant women and 2 drinks a day for men. One drink equals 12 oz of beer, 5 oz of wine, or 1 oz of hard liquor.  Do not use alcohol or drugs to relax.  Eat a balanced diet that includes fresh fruits and vegetables, whole grains, lean meats, fish, eggs, and beans, and low-fat dairy. Avoid processed foods and foods high in added fat, sugar, and salt.  Exercise at least 30 minutes on 5 or more days each week.  Get 7-8 hours of sleep each night. General instructions   Practice stress management techniques as discussed with your health care provider.  Drink enough fluid to keep your urine clear or pale yellow.  Take over-the-counter and prescription medicines only as told by your health care provider.  Keep all follow-up visits as told by your health care provider. This is important. Contact a health care provider if:  Your symptoms get worse.  You have new symptoms.  You feel  overwhelmed by your problems and can no longer manage them on your own. Get help right away if:  You have thoughts of hurting yourself or others. If you ever feel like you may hurt yourself or others, or have thoughts about taking your own life, get help right away. You can go to your nearest emergency department or call:  Your local emergency services (911 in the [place]  A suicide crisis helpline, such as the [facility] at 512-154-2021. This is open 24 hours a day. Summary  Stress is a normal reaction to life events. It can cause problems if it happens too often or for too long.  Practicing stress management techniques is the best way to treat stress.  Counseling or talk therapy with a mental health professional may be helpful if you are having trouble managing stress on your own. This information is not intended to replace advice given to you by your  health care provider. Make sure you discuss any questions you have with your health care provider. Document Released: 09/29/2000 Document Revised: 03/18/2017 Document Reviewed: 05/26/2016 Elsevier Patient Education  [employer].    If you have lab work done today you will be contacted with your lab results within the next 2 weeks.  If you have not heard from [place] then please contact us. The fastest way to get your results is to register for My Chart.   IF you received an x-ray today, you will receive an invoice from [facility]. Please contact [facility] at [phone] with questions or concerns regarding your invoice.   IF you received labwork today, you will receive an invoice from [facility]. Please contact [facility] at [phone] with questions or concerns regarding your invoice.   Our billing staff will not be able to assist you with questions regarding bills from these companies.  You will be contacted with the lab results as soon as they are available. The fastest way to get your  results is to activate your My Chart account. Instructions are located on the last page of this paperwork. If you have not heard from [place] regarding the results in 2 weeks, please contact this office.       Signed,   Merri Ray, MD [facility].  10/31/18 10:19 AM

## 2018-11-07 ENCOUNTER — Other Ambulatory Visit: Payer: Self-pay

## 2018-11-07 ENCOUNTER — Ambulatory Visit
Admission: RE | Admit: 2018-11-07 | Discharge: 2018-11-07 | Disposition: A | Discharge status: Home / Self Care | Payer: 59 | Source: Ambulatory Visit | Attending: Obstetrics & Gynecology | Admitting: Obstetrics & Gynecology

## 2018-11-07 DIAGNOSIS — Z1231 Encounter for screening mammogram for malignant neoplasm of breast: Secondary | ICD-10-CM

## 2019-01-28 ENCOUNTER — Other Ambulatory Visit: Payer: Self-pay | Admitting: Family Medicine

## 2019-01-28 DIAGNOSIS — F329 Major depressive disorder, single episode, unspecified: Secondary | ICD-10-CM

## 2019-01-28 DIAGNOSIS — G4700 Insomnia, unspecified: Secondary | ICD-10-CM

## 2019-01-28 DIAGNOSIS — F32A Depression, unspecified: Secondary | ICD-10-CM

## 2019-05-16 ENCOUNTER — Other Ambulatory Visit: Payer: Self-pay | Admitting: Gastroenterology

## 2019-09-13 ENCOUNTER — Other Ambulatory Visit: Payer: Self-pay

## 2019-09-14 ENCOUNTER — Encounter: Payer: Self-pay | Admitting: Obstetrics & Gynecology

## 2019-09-14 ENCOUNTER — Ambulatory Visit (INDEPENDENT_AMBULATORY_CARE_PROVIDER_SITE_OTHER): Payer: 59 | Admitting: Obstetrics & Gynecology

## 2019-09-14 VITALS — BP 124/80 | Ht 67.0 in | Wt 189.8 lb

## 2019-09-14 DIAGNOSIS — E663 Overweight: Secondary | ICD-10-CM | POA: Diagnosis not present

## 2019-09-14 DIAGNOSIS — N946 Dysmenorrhea, unspecified: Secondary | ICD-10-CM | POA: Diagnosis not present

## 2019-09-14 DIAGNOSIS — Z01419 Encounter for gynecological examination (general) (routine) without abnormal findings: Secondary | ICD-10-CM

## 2019-09-14 DIAGNOSIS — Z9851 Tubal ligation status: Secondary | ICD-10-CM | POA: Diagnosis not present

## 2019-09-14 MED ORDER — NORETHINDRONE ACET-ETHINYL EST 1-20 MG-MCG PO TABS: 1.0000 | ORAL_TABLET | Freq: Every day | ORAL | 4 refills | Status: DC

## 2019-09-14 NOTE — Addendum Note (Signed)
Addended by: Thurnell Garbe A on: 09/14/2019 11:52 AM   Modules accepted: Orders

## 2019-09-14 NOTE — Patient Instructions (Signed)
1. Encounter for routine gynecological examination with Papanicolaou smear of cervix Normal gynecologic exam. Pap reflex done. Breast exam normal. Last mammogram July 2020 was negative. Colonoscopy 2020. Health labs with family physician.  2. S/P tubal ligation  3. Dysmenorrhea Continue on continuous birth control pill to control dysmenorrhea. No contraindication to birth control pills. Prescription sent to pharmacy.  4. Overweight (BMI 25.0-29.9) We will continue on a lower calorie/carb diet such as [employer]. Aerobic activities 5 times a week and light weightlifting every 2 days.  Other orders - norethindrone-ethinyl estradiol (GILDESS 1/20) 1-20 MG-MCG tablet; Take 1 tablet by mouth daily. Continuous use because of Dysmenorrhea  Shandi, it was a pleasure seeing you today! I will inform you of your results as soon as they are available.

## 2019-09-14 NOTE — Progress Notes (Signed)
Laurie Bond 08/28/1970 [id number]   History:    49 y.o. G2P2L2 Married.  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  RP:  Established patient presenting for annual gyn exam   HPI:  Well on continuous BCPs.  No BTB.  No pelvic pain.  No pain with IC.  Normal vaginal secretions.  Urine/BMs normal.  Breasts normal.  BMI 29.73.  Enjoys walking.  Health Labs with Fam MD.   Past medical history,surgical history, family history and social history were all reviewed and documented in the EPIC chart.  Gynecologic History No LMP recorded. (Menstrual status: Oral contraceptives).  Obstetric History OB History  Gravida Para Term Preterm AB Living  2 2       2   SAB TAB Ectopic Multiple Live Births               # Outcome Date GA Lbr Len/2nd Weight Sex Delivery Anes PTL Lv  2 Para           1 Para              ROS: A ROS was performed and pertinent positives and negatives are included in the history.  GENERAL: No fevers or chills. HEENT: No change in vision, no earache, sore throat or sinus congestion. NECK: No pain or stiffness. CARDIOVASCULAR: No chest pain or pressure. No palpitations. PULMONARY: No shortness of breath, cough or wheeze. GASTROINTESTINAL: No abdominal pain, nausea, vomiting or diarrhea, melena or bright red blood per rectum. GENITOURINARY: No urinary frequency, urgency, hesitancy or dysuria. MUSCULOSKELETAL: No joint or muscle pain, no back pain, no recent trauma. DERMATOLOGIC: No rash, no itching, no lesions. ENDOCRINE: No polyuria, polydipsia, no heat or cold intolerance. No recent change in weight. HEMATOLOGICAL: No anemia or easy bruising or bleeding. NEUROLOGIC: No headache, seizures, numbness, tingling or weakness. PSYCHIATRIC: No depression, no loss of interest in normal activity or change in sleep pattern.     Exam:   BP 124/80   Ht 5\' 7"  (1.702 m)   Wt 189 lb 12.8 oz (86.1 kg)   BMI 29.73 kg/m   Body  mass index is 29.73 kg/m.  General appearance : Well developed well nourished female. No acute distress HEENT: Eyes: no retinal hemorrhage or exudates,  Neck supple, trachea midline, no carotid bruits, no thyroidmegaly Lungs: Clear to auscultation, no rhonchi or wheezes, or rib retractions  Heart: Regular rate and rhythm, no murmurs or gallops Breast:Examined in sitting and supine position were symmetrical in appearance, no palpable masses or tenderness,  no skin retraction, no nipple inversion, no nipple discharge, no skin discoloration, no axillary or supraclavicular lymphadenopathy Abdomen: no palpable masses or tenderness, no rebound or guarding Extremities: no edema or skin discoloration or tenderness  Pelvic: Vulva: Normal             Vagina: No gross lesions or discharge  Cervix: No gross lesions or discharge.  Pap reflex done.  Uterus  AV, normal size, shape and consistency, non-tender and mobile  Adnexa  Without masses or tenderness  Anus: Normal   Assessment/Plan:  49 y.o. female for annual exam   1. Encounter for routine gynecological examination with Papanicolaou smear of cervix Normal gynecologic exam. Pap reflex done. Breast exam normal. Last mammogram July 2020 was negative. Colonoscopy 2020. Health labs with family physician.  2. S/P tubal ligation  3. Dysmenorrhea Continue on continuous birth control pill to control dysmenorrhea. No contraindication to  birth control pills. Prescription sent to pharmacy.  4. Overweight (BMI 25.0-29.9) We will continue on a lower calorie/carb diet such as [employer]. Aerobic activities 5 times a week and light weightlifting every 2 days.  Other orders - norethindrone-ethinyl estradiol (GILDESS 1/20) 1-20 MG-MCG tablet; Take 1 tablet by mouth daily. Continuous use because of Dysmenorrhea  Princess Bruins MD, 10:42 AM 09/14/2019

## 2019-09-24 LAB — PAP IG W/ RFLX HPV ASCU

## 2019-09-24 LAB — HUMAN PAPILLOMAVIRUS, HIGH RISK: HPV DNA High Risk: NOT DETECTED

## 2019-12-14 ENCOUNTER — Other Ambulatory Visit: Payer: Self-pay | Admitting: Obstetrics & Gynecology

## 2019-12-14 DIAGNOSIS — Z0000 Encounter for general adult medical examination without abnormal findings: Secondary | ICD-10-CM

## 2019-12-27 ENCOUNTER — Ambulatory Visit: Payer: Self-pay

## 2019-12-27 ENCOUNTER — Other Ambulatory Visit: Payer: Self-pay | Admitting: Emergency Medicine

## 2019-12-27 ENCOUNTER — Telehealth: Payer: Self-pay | Admitting: Family Medicine

## 2019-12-27 DIAGNOSIS — Z0000 Encounter for general adult medical examination without abnormal findings: Secondary | ICD-10-CM

## 2019-12-27 DIAGNOSIS — E785 Hyperlipidemia, unspecified: Secondary | ICD-10-CM

## 2019-12-27 NOTE — Telephone Encounter (Signed)
Lab order has been place

## 2019-12-27 NOTE — Addendum Note (Signed)
Addended by: Norton Blizzard R on: 07/25/759 12:06 PM   Modules accepted: Orders

## 2019-12-27 NOTE — Telephone Encounter (Signed)
Pt is needing orders for cpe on 04/03/20. Please advise

## 2019-12-31 ENCOUNTER — Ambulatory Visit
Admission: RE | Admit: 2019-12-31 | Discharge: 2019-12-31 | Disposition: A | Discharge status: Home / Self Care | Payer: 59 | Source: Ambulatory Visit | Attending: Obstetrics & Gynecology | Admitting: Obstetrics & Gynecology

## 2019-12-31 ENCOUNTER — Other Ambulatory Visit: Payer: Self-pay

## 2019-12-31 DIAGNOSIS — Z0000 Encounter for general adult medical examination without abnormal findings: Secondary | ICD-10-CM

## 2020-01-03 ENCOUNTER — Other Ambulatory Visit: Payer: Self-pay | Admitting: Family Medicine

## 2020-01-03 DIAGNOSIS — F32A Depression, unspecified: Secondary | ICD-10-CM

## 2020-01-03 DIAGNOSIS — G4700 Insomnia, unspecified: Secondary | ICD-10-CM

## 2020-01-03 NOTE — Telephone Encounter (Signed)
Requested medication (s) are due for refill today: no  Requested medication (s) are on the active medication list: yes   Last refill:  12/29/2019  Future visit scheduled: yes   Notes to clinic: patient has zero refills remaining    Requested Prescriptions  Pending Prescriptions Disp Refills   FLUoxetine (PROZAC) 40 MG capsule [Pharmacy Med Name: FLUOXETINE 40MG  CAPSULES] 90 capsule 3    Sig: TAKE 1 CAPSULE(40 MG) BY MOUTH DAILY      Psychiatry:  Antidepressants - SSRI Failed - 01/03/2020  8:03 AM      Failed - Completed PHQ-2 or PHQ-9 in the last 360 days.      Failed - Valid encounter within last 6 months    Recent Outpatient Visits           1 year ago Foot injury, left, initial encounter   [facility], Ranell Patrick, MD   1 year ago Annual physical exam   [facility], Ranell Patrick, MD   1 year ago LLQ abdominal pain   [facility], Ranell Patrick, MD   2 years ago Annual physical exam   [facility] at [facility], [place], [place]   3 years ago Annual physical exam   [facility] at [facility], [place], [place]       Future Appointments             In 3 months Carlota Raspberry Ranell Patrick, MD [facility] at [place], [place]              traZODone (DESYREL) 50 MG tablet [Pharmacy Med Name: TRAZODONE 50MG  TABLETS] 90 tablet 3    Sig: TAKE 1 TABLET(50 MG) BY MOUTH AT BEDTIME      Psychiatry: Antidepressants - Serotonin Modulator Failed - 01/03/2020  8:03 AM      Failed - Completed PHQ-2 or PHQ-9 in the last 360 days.      Failed - Valid encounter within last 6 months    Recent Outpatient Visits           1 year ago Foot injury, left, initial encounter   [facility], Ranell Patrick, MD   1 year ago Annual physical exam   [facility], [place]   1 year ago LLQ abdominal pain   [facility], Ranell Patrick, MD   2 years ago Annual physical exam   Primary Care at [facility], [place], [place]    3 years ago Annual physical exam   Primary Care at [facility], [place], [place]       Future Appointments             In 3 months Carlota Raspberry, Ranell Patrick, MD [facility] at [place], [facility]

## 2020-01-04 NOTE — Telephone Encounter (Signed)
Patient is requesting a refill of the following medications: Requested Prescriptions   Pending Prescriptions Disp Refills   FLUoxetine (PROZAC) 40 MG capsule [Pharmacy Med Name: FLUOXETINE 40MG  CAPSULES] 90 capsule 3    Sig: TAKE 1 CAPSULE(40 MG) BY MOUTH DAILY   traZODone (DESYREL) 50 MG tablet [Pharmacy Med Name: TRAZODONE 50MG  TABLETS] 90 tablet 3    Sig: TAKE 1 TABLET(50 MG) BY MOUTH AT BEDTIME    Date of patient request: 01/03/2020 Last office visit: 10/30/18 Date of last refill: 01/29/19 Last refill amount: 90 Follow up time period per chart: N/A

## 2020-01-05 NOTE — Telephone Encounter (Signed)
Refilled -must keep appointment in December as scheduled.

## 2020-04-03 ENCOUNTER — Ambulatory Visit (INDEPENDENT_AMBULATORY_CARE_PROVIDER_SITE_OTHER): Payer: 59 | Admitting: Family Medicine

## 2020-04-03 ENCOUNTER — Other Ambulatory Visit: Payer: Self-pay

## 2020-04-03 ENCOUNTER — Encounter: Payer: Self-pay | Admitting: Family Medicine

## 2020-04-03 VITALS — BP 146/80 | HR 94 | Temp 97.8°F | Ht 67.0 in | Wt 196.2 lb

## 2020-04-03 DIAGNOSIS — F32A Depression, unspecified: Secondary | ICD-10-CM | POA: Diagnosis not present

## 2020-04-03 DIAGNOSIS — G4700 Insomnia, unspecified: Secondary | ICD-10-CM | POA: Diagnosis not present

## 2020-04-03 DIAGNOSIS — H538 Other visual disturbances: Secondary | ICD-10-CM | POA: Diagnosis not present

## 2020-04-03 DIAGNOSIS — E785 Hyperlipidemia, unspecified: Secondary | ICD-10-CM

## 2020-04-03 DIAGNOSIS — Z0001 Encounter for general adult medical examination with abnormal findings: Secondary | ICD-10-CM | POA: Diagnosis not present

## 2020-04-03 DIAGNOSIS — Z0000 Encounter for general adult medical examination without abnormal findings: Secondary | ICD-10-CM

## 2020-04-03 MED ORDER — FLUOXETINE HCL 40 MG PO CAPS: ORAL_CAPSULE | ORAL | 3 refills | Status: DC

## 2020-04-03 MED ORDER — OMEPRAZOLE 20 MG PO CPDR: DELAYED_RELEASE_CAPSULE | ORAL | 3 refills | Status: DC

## 2020-04-03 MED ORDER — TRAZODONE HCL 50 MG PO TABS
ORAL_TABLET | ORAL | 3 refills | Status: DC
Start: 2020-04-03 — End: 2020-10-02

## 2020-04-03 NOTE — Patient Instructions (Addendum)
Keep a record of your blood pressures outside of the office and remaining over 130/80 let me know.  Let me know if new numbers needed for therapist. No med changes for now.   Try new contact. If blurriness not improving, or continued pain behind eye - I would recommend discussing with eye specialist or can refer you to neuro.   Return to the clinic or go to the nearest emergency room if any of your symptoms worsen or new symptoms occur.  No med changes today.   Try to increase days of exercise - even if shorter time. Goal is 12min per week, spread out over most days.    Keeping You Healthy  Get These Tests  Blood Pressure- Have your blood pressure checked by your healthcare provider at least once a year.  Normal blood pressure is 120/80.  Weight- Have your body mass index (BMI) calculated to screen for obesity.  BMI is a measure of body fat based on height and weight.  You can calculate your own BMI at [employer]velBags.it  Cholesterol- Have your cholesterol checked every year.  Diabetes- Have your blood sugar checked every year if you have high blood pressure, high cholesterol, a family history of diabetes or if you are overweight.  Pap Test - Have a pap test every 1 to 5 years if you have been sexually active.  If you are older than 65 and recent pap tests have been normal you may not need additional pap tests.  In addition, if you have had a hysterectomy  for benign disease additional pap tests are not necessary.  Mammogram-Yearly mammograms are essential for early detection of breast cancer  Screening for Colon Cancer- Colonoscopy starting at age 30. Screening may begin sooner depending on your family history and other health conditions.  Follow up colonoscopy as directed by your Gastroenterologist.  Screening for Osteoporosis- Screening begins at age 58 with bone density scanning, sooner if you are at higher risk for developing Osteoporosis.  Get these  medicines  Calcium with Vitamin D- Your body requires 1200-1500 mg of Calcium a day and [phone] IU of Vitamin D a day.  You can only absorb 500 mg of Calcium at a time therefore Calcium must be taken in 2 or 3 separate doses throughout the day.  Hormones- Hormone therapy has been associated with increased risk for certain cancers and heart disease.  Talk to your healthcare provider about if you need relief from menopausal symptoms.  Aspirin- Ask your healthcare provider about taking Aspirin to prevent Heart Disease and Stroke.  Get these Immuniztions  Flu shot- Every fall  Pneumonia shot- Once after the age of 72; if you are younger ask your healthcare provider if you need a pneumonia shot.  Tetanus- Every ten years.  Zostavax- Once after the age of 38 to prevent shingles.  Take these steps  Don't smoke- Your healthcare provider can help you quit. For tips on how to quit, ask your healthcare provider or go to www.smokefree.gov or call 1-800 QUIT-NOW.  Be physically active- Exercise 5 days a week for a minimum of 30 minutes.  If you are not already physically active, start slow and gradually work up to 30 minutes of moderate physical activity.  Try walking, dancing, bike riding, swimming, etc.  Eat a healthy diet- Eat a variety of healthy foods such as fruits, vegetables, whole grains, low fat milk, low fat cheeses, yogurt, lean meats, chicken, fish, eggs, dried beans, tofu, etc.  For more information go  to www.thenutritionsource.org  Dental visit- Brush and floss teeth twice daily; visit your dentist twice a year.  Eye exam- Visit your Optometrist or Ophthalmologist yearly.  Drink alcohol in moderation- Limit alcohol intake to one drink or less a day.  Never drink and drive.  Depression- Your emotional health is as important as your physical health.  If you're feeling down or losing interest in things you normally enjoy, please talk to your healthcare provider.  Seat Belts- can  save your life; always wear one  Smoke/Carbon Monoxide detectors- These detectors need to be installed on the appropriate level of your home.  Replace batteries at least once a year.  Violence- If anyone is threatening or hurting you, please tell your healthcare provider.  Living Will/ Health care power of attorney- Discuss with your healthcare provider and family.  If you have lab work done today you will be contacted with your lab results within the next 2 weeks.  If you have not heard from [place] then please contact us. The fastest way to get your results is to register for My Chart.   IF you received an x-ray today, you will receive an invoice from [facility]. Please contact [facility] at [phone] with questions or concerns regarding your invoice.   IF you received labwork today, you will receive an invoice from [place]. Please contact [facility] at [phone] with questions or concerns regarding your invoice.   Our billing staff will not be able to assist you with questions regarding bills from these companies.  You will be contacted with the lab results as soon as they are available. The fastest way to get your results is to activate your My Chart account. Instructions are located on the last page of this paperwork. If you have not heard from [place] regarding the results in 2 weeks, please contact this office.

## 2020-04-03 NOTE — Progress Notes (Signed)
Subjective:  Patient ID: Laurie Bond, female    DOB: Oct 01, 1970  Age: 49 y.o. MRN: [id number]  CC:  Chief Complaint  Patient presents with  . Annual Exam    Patient here today for annual physical. Patient is seeing an eye doc but do want to bring up issues with the right eye    HPI [name] presents for  Annual physical exam  Depression: With history of insomnia.  Treated with fluoxetine 40 mg daily, trazodone 50 mg nightly.  Situational stressors discussed in July.  Coping techniques discussed, counseling recommended with phone number provided.  Continued current regimen. Stress better, some other stressors - had covid, meeting with therapist with her dtr, mom had fall. felt better after week at [facility] in [place]. This has helped.  Stress at work this am - stressful call prior to today's ov.  Meditation, deep breathing. Walking more/exercise. Some stress eating. Working on this.  No new side effects with meds - would like to continue same. Has not met with therapist, but considering. Has numbers if needed.   BP Readings from Last 3 Encounters:  04/03/20 (!) 146/80  09/14/19 124/80  10/30/18 139/82     Depression screen PHQ 2/9 04/03/2020 10/30/2018 10/30/2018 03/07/2018 01/26/2018  Decreased Interest 0 0 0 0 0  Down, Depressed, Hopeless 0 1 0 0 0  PHQ - 2 Score 0 1 0 0 0   GERD Omeprazole 20 mg daily.Endoscopy January 2020 with gastric biopsies that were negative for H. pylori.  Chronic gastritis, negative for dysplasia. Continuing PPI QD - managing sx's well. Rare breakthrough sxs with alcohol.  Alcohol 1 drink per week.    Cancer screening Colonoscopy May 03, 2018, 1 polyp removed.  Benign but precancerous.  5-year repeat planned.   Mammogram 01/02/2020 Pap testing 09/14/2019, ASCUS.  HPV high-risk negative, repeat Pap in 1 year.  Dr. Dellis Filbert   Immunization History  Administered Date(s) Administered  . Hepatitis A 01/25/2006,  08/23/2006  . Hepatitis B 03/02/2006, 04/21/2006, 08/23/2006  . Influenza Split 01/31/2008, 12/27/2008, 01/21/2010, 01/18/2012, 01/17/2013  . Influenza,inj,Quad PF,6+ Mos 02/04/2017  . Influenza-Unspecified 01/15/2014, 02/16/2020  . MMR 05/28/1970, 01/24/1990  . Moderna Sars-Covid-2 Vaccination 04/18/2019, 05/16/2019  . PPD Test 02/18/2007, 02/10/2008, 06/18/2008, 07/08/2009, 09/17/2010  . Tdap 01/25/2006, 01/27/2016  plan for covid vaccine booster in January. covid disease 11/26/19    Hearing Screening   '125Hz'  '250Hz'  '500Hz'  '1000Hz'  '2000Hz'  '3000Hz'  '4000Hz'  '6000Hz'  '8000Hz'   Right ear:           Left ear:             Visual Acuity Screening   Right eye Left eye Both eyes  Without correction: '20/70 20/20 20/20 '  With correction:     Has been having some issues with R eye. More blurry past 6 months. Checkup 1 month ago - decreased R vision. Retinal scan, other testing ok. Rare pain. Adjusted contact Rx, has not picked up newest contact yet. No scheduled follow up. Slight pain behind eye, watering at times.  Dr. Gretta Cool- optometry.wearing contact.  No hx of migraine. No episodes of blurry changes - persistent.   Dental: every 6 months.   Exercise: 2-3 days per week. 54mn.    History Patient Active Problem List   Diagnosis Date Noted  . Insomnia 01/27/2016  . Hyperlipidemia 03/07/2012  . Depression 03/07/2012  . Immune to varicella 03/07/2006   Past Medical History:  Diagnosis Date  . Cholecystitis with cholelithiasis  03/15/2015  . Depression   . Dyspepsia and disorder of function of stomach 01/16/2014   Mild reflux symptoms, effectively reduced with Prilosec.   Marland Kitchen Gestational diabetes 2010   2nd of 2 pregnancies  . Hyperlipidemia   . Obesity   . Pre-eclampsia 2004  . Pre-eclampsia 2004   1st of 2 pregnancies   Past Surgical History:  Procedure Laterality Date  . LAPAROSCOPIC CHOLECYSTECTOMY    . TUBAL LIGATION    . WISDOM TOOTH EXTRACTION     No Active Allergies Prior to  Admission medications   Medication Sig Start Date End Date Taking? Authorizing Provider  FLUoxetine (PROZAC) 40 MG capsule TAKE 1 CAPSULE(40 MG) BY MOUTH DAILY 01/05/20  Yes Wendie Agreste, MD  norethindrone-ethinyl estradiol (GILDESS 1/20) 1-20 MG-MCG tablet Take 1 tablet by mouth daily. Continuous use because of Dysmenorrhea 09/14/19  Yes Princess Bruins, MD  omeprazole (PRILOSEC) 20 MG capsule TAKE 1 CAPSULE(20 MG) BY MOUTH DAILY 05/16/19  Yes Mauri Pole, MD  traZODone (DESYREL) 50 MG tablet TAKE 1 TABLET(50 MG) BY MOUTH AT BEDTIME 01/05/20  Yes Wendie Agreste, MD   Social History   Socioeconomic History  . Marital status: Married    Spouse name: Shanon Brow  . Number of children: 2  . Years of education: 28  . Highest education level: Not on file  Occupational History  . Occupation: Programmer, multimedia: [employer]  Tobacco Use  . Smoking status: Never Smoker  . Smokeless tobacco: Never Used  Vaping Use  . Vaping Use: Never used  Substance and Sexual Activity  . Alcohol use: Yes    Alcohol/week: 0.0 - 1.0 standard drinks    Comment: once a week   . Drug use: No  . Sexual activity: Yes    Partners: Male    Birth control/protection: Pill    Comment: 1st intercourse- 56, partners- 66, married- 61 yrs   Other Topics Concern  . Not on file  Social History Narrative   Lives with husband and 2 children.   Social Determinants of Health   Financial Resource Strain: Not on file  Food Insecurity: Not on file  Transportation Needs: Not on file  Physical Activity: Not on file  Stress: Not on file  Social Connections: Not on file  Intimate Partner Violence: Not on file    Review of Systems 13 point review of systems per patient health survey noted.  Negative other than as indicated above or in HPI.   Objective:   Vitals:   04/03/20 1106 04/03/20 1119  BP: (!) 155/88 (!) 146/80  Pulse: 94   Temp: 97.8 F (36.6 C)   TempSrc: Temporal   SpO2: 99%    Weight: 196 lb 3.2 oz (89 kg)   Height: '5\' 7"'  (1.702 m)      Physical Exam Vitals reviewed.  Constitutional:      Appearance: She is well-developed and well-nourished.  HENT:     Head: Normocephalic and atraumatic.  Eyes:     Extraocular Movements: EOM normal.     Conjunctiva/sclera: Conjunctivae normal.     Pupils: Pupils are equal, round, and reactive to light.  Neck:     Vascular: No carotid bruit.  Cardiovascular:     Rate and Rhythm: Normal rate and regular rhythm.     Pulses: Intact distal pulses.     Heart sounds: Normal heart sounds.  Pulmonary:     Effort: Pulmonary effort is normal.  Breath sounds: Normal breath sounds.  Abdominal:     Palpations: Abdomen is soft. There is no pulsatile mass.     Tenderness: There is no abdominal tenderness.  Skin:    General: Skin is warm and dry.  Neurological:     Mental Status: She is alert and oriented to person, place, and time.  Psychiatric:        Mood and Affect: Mood and affect normal.        Behavior: Behavior normal.     Assessment & Plan:  Traci Keiri Solano is a 49 y.o. female . Annual physical exam  - -anticipatory guidance as below in AVS, screening labs obtained. Health maintenance items as above in HPI discussed/recommended as applicable.   Depression, unspecified depression type - Plan: FLUoxetine (PROZAC) 40 MG capsule Insomnia, unspecified type - Plan: traZODone (DESYREL) 50 MG tablet  - situational stress, no med changes at this time. Option of counseling. Continue exercise, stress mgt techniques.    Blurred vision, right eye  - s/p optho eval. Intermittent minimal pain behind eye. May be related to decreased visual acuity - recommend trying new contact first, then optho follow up or possible neuro eval, but less likely ophthalmic migraine.    Meds ordered this encounter  Medications  . FLUoxetine (PROZAC) 40 MG capsule    Sig: TAKE 1 CAPSULE(40 MG) BY MOUTH DAILY    Dispense:  90 capsule     Refill:  3  . traZODone (DESYREL) 50 MG tablet    Sig: TAKE 1 TABLET(50 MG) BY MOUTH AT BEDTIME    Dispense:  90 tablet    Refill:  3  . omeprazole (PRILOSEC) 20 MG capsule    Sig: TAKE 1 CAPSULE(20 MG) BY MOUTH DAILY    Dispense:  90 capsule    Refill:  3   Patient Instructions    Keep a record of your blood pressures outside of the office and remaining over 130/80 let me know.  Let me know if new numbers needed for therapist. No med changes for now.   Try new contact. If blurriness not improving, or continued pain behind eye - I would recommend discussing with eye specialist or can refer you to neuro.   Return to the clinic or go to the nearest emergency room if any of your symptoms worsen or new symptoms occur.  No med changes today.   Try to increase days of exercise - even if shorter time. Goal is 117mn per week, spread out over most days.    Keeping You Healthy  Get These Tests  Blood Pressure- Have your blood pressure checked by your healthcare provider at least once a year.  Normal blood pressure is 120/80.  Weight- Have your body mass index (BMI) calculated to screen for obesity.  BMI is a measure of body fat based on height and weight.  You can calculate your own BMI at wGravelBags.it Cholesterol- Have your cholesterol checked every year.  Diabetes- Have your blood sugar checked every year if you have high blood pressure, high cholesterol, a family history of diabetes or if you are overweight.  Pap Test - Have a pap test every 1 to 5 years if you have been sexually active.  If you are older than 65 and recent pap tests have been normal you may not need additional pap tests.  In addition, if you have had a hysterectomy  for benign disease additional pap tests are not necessary.  Mammogram-Yearly mammograms are essential  for early detection of breast cancer  Screening for Colon Cancer- Colonoscopy starting at age 88. Screening may begin sooner depending  on your family history and other health conditions.  Follow up colonoscopy as directed by your Gastroenterologist.  Screening for Osteoporosis- Screening begins at age 33 with bone density scanning, sooner if you are at higher risk for developing Osteoporosis.  Get these medicines  Calcium with Vitamin D- Your body requires 1200-1500 mg of Calcium a day and [phone] IU of Vitamin D a day.  You can only absorb 500 mg of Calcium at a time therefore Calcium must be taken in 2 or 3 separate doses throughout the day.  Hormones- Hormone therapy has been associated with increased risk for certain cancers and heart disease.  Talk to your healthcare provider about if you need relief from menopausal symptoms.  Aspirin- Ask your healthcare provider about taking Aspirin to prevent Heart Disease and Stroke.  Get these Immuniztions  Flu shot- Every fall  Pneumonia shot- Once after the age of 39; if you are younger ask your healthcare provider if you need a pneumonia shot.  Tetanus- Every ten years.  Zostavax- Once after the age of 20 to prevent shingles.  Take these steps  Don't smoke- Your healthcare provider can help you quit. For tips on how to quit, ask your healthcare provider or go to www.smokefree.gov or call 1-800 QUIT-NOW.  Be physically active- Exercise 5 days a week for a minimum of 30 minutes.  If you are not already physically active, start slow and gradually work up to 30 minutes of moderate physical activity.  Try walking, dancing, bike riding, swimming, etc.  Eat a healthy diet- Eat a variety of healthy foods such as fruits, vegetables, whole grains, low fat milk, low fat cheeses, yogurt, lean meats, chicken, fish, eggs, dried beans, tofu, etc.  For more information go to www.thenutritionsource.org  Dental visit- Brush and floss teeth twice daily; visit your dentist twice a year.  Eye exam- Visit your Optometrist or Ophthalmologist yearly.  Drink alcohol in moderation- Limit  alcohol intake to one drink or less a day.  Never drink and drive.  Depression- Your emotional health is as important as your physical health.  If you're feeling down or losing interest in things you normally enjoy, please talk to your healthcare provider.  Seat Belts- can save your life; always wear one  Smoke/Carbon Monoxide detectors- These detectors need to be installed on the appropriate level of your home.  Replace batteries at least once a year.  Violence- If anyone is threatening or hurting you, please tell your healthcare provider.  Living Will/ Health care power of attorney- Discuss with your healthcare provider and family.  If you have lab work done today you will be contacted with your lab results within the next 2 weeks.  If you have not heard from [place] then please contact us. The fastest way to get your results is to register for My Chart.   IF you received an x-ray today, you will receive an invoice from [facility]. Please contact [facility] at [phone] with questions or concerns regarding your invoice.   IF you received labwork today, you will receive an invoice from [facility]. Please contact [facility] at [phone] with questions or concerns regarding your invoice.   Our billing staff will not be able to assist you with questions regarding bills from these companies.  You will be contacted with the lab results as soon as they are available. The fastest  way to get your results is to activate your My Chart account. Instructions are located on the last page of this paperwork. If you have not heard from [place] regarding the results in 2 weeks, please contact this office.          Signed, Merri Ray, MD [facility]

## 2020-04-04 LAB — COMPREHENSIVE METABOLIC PANEL
ALT: 13 IU/L (ref 0–32)
AST: 15 IU/L (ref 0–40)
Albumin/Globulin Ratio: 1.6 (ref 1.2–2.2)
Albumin: 4.4 g/dL (ref 3.8–4.8)
Alkaline Phosphatase: 71 IU/L (ref 44–121)
BUN/Creatinine Ratio: 15 (ref 9–23)
BUN: 12 mg/dL (ref 6–24)
Bilirubin Total: 0.3 mg/dL (ref 0.0–1.2)
CO2: 21 mmol/L (ref 20–29)
Calcium: 8.9 mg/dL (ref 8.7–10.2)
Chloride: 103 mmol/L (ref 96–106)
Creatinine, Ser: 0.82 mg/dL (ref 0.57–1.00)
GFR calc Af Amer: 96 mL/min/{1.73_m2} (ref >59)
GFR calc non Af Amer: 84 mL/min/{1.73_m2} (ref >59)
Globulin, Total: 2.7 g/dL (ref 1.5–4.5)
Glucose: 90 mg/dL (ref 65–99)
Potassium: 4.7 mmol/L (ref 3.5–5.2)
Sodium: 138 mmol/L (ref 134–144)
Total Protein: 7.1 g/dL (ref 6.0–8.5)

## 2020-04-04 LAB — LIPID PANEL
Chol/HDL Ratio: 3.9 ratio (ref 0.0–4.4)
Cholesterol, Total: 232 mg/dL — ABNORMAL HIGH (ref 100–199)
HDL: 59 mg/dL (ref >39)
LDL Chol Calc (NIH): 143 mg/dL — ABNORMAL HIGH (ref 0–99)
Triglycerides: 169 mg/dL — ABNORMAL HIGH (ref 0–149)
VLDL Cholesterol Cal: 30 mg/dL (ref 5–40)

## 2020-04-24 ENCOUNTER — Encounter: Payer: Self-pay | Admitting: Family Medicine

## 2020-04-24 ENCOUNTER — Ambulatory Visit: Payer: 59 | Admitting: Family Medicine

## 2020-04-24 ENCOUNTER — Other Ambulatory Visit: Payer: Self-pay

## 2020-04-24 VITALS — BP 152/94 | HR 78 | Temp 98.3°F | Ht 67.0 in | Wt 200.0 lb

## 2020-04-24 DIAGNOSIS — E785 Hyperlipidemia, unspecified: Secondary | ICD-10-CM | POA: Diagnosis not present

## 2020-04-24 DIAGNOSIS — R319 Hematuria, unspecified: Secondary | ICD-10-CM

## 2020-04-24 DIAGNOSIS — I10 Essential (primary) hypertension: Secondary | ICD-10-CM

## 2020-04-24 LAB — POCT WET + KOH PREP
Trich by wet prep: ABSENT
Yeast by KOH: ABSENT
Yeast by wet prep: ABSENT

## 2020-04-24 LAB — POC MICROSCOPIC URINALYSIS (UMFC): Mucus: ABSENT

## 2020-04-24 LAB — POCT URINALYSIS DIP (MANUAL ENTRY)
Bilirubin, UA: NEGATIVE
Glucose, UA: NEGATIVE mg/dL
Ketones, POC UA: NEGATIVE mg/dL
Leukocytes, UA: NEGATIVE
Nitrite, UA: NEGATIVE
Protein Ur, POC: NEGATIVE mg/dL
Spec Grav, UA: 1.025 (ref 1.010–1.025)
Urobilinogen, UA: 0.2 E.U./dL
pH, UA: 6.5 (ref 5.0–8.0)

## 2020-04-24 NOTE — Patient Instructions (Addendum)
Keep monitoring BP at home   Health Maintenance, Female Adopting a healthy lifestyle and getting preventive care are important in promoting health and wellness. Ask your health care provider about:  The right schedule for you to have regular tests and exams.  Things you can do on your own to prevent diseases and keep yourself healthy. What should I know about diet, weight, and exercise? Eat a healthy diet   Eat a diet that includes plenty of vegetables, fruits, low-fat dairy products, and lean protein.  Do not eat a lot of foods that are high in solid fats, added sugars, or sodium. Maintain a healthy weight Body mass index (BMI) is used to identify weight problems. It estimates body fat based on height and weight. Your health care provider can help determine your BMI and help you achieve or maintain a healthy weight. Get regular exercise Get regular exercise. This is one of the most important things you can do for your health. Most adults should:  Exercise for at least 150 minutes each week. The exercise should increase your heart rate and make you sweat (moderate-intensity exercise).  Do strengthening exercises at least twice a week. This is in addition to the moderate-intensity exercise.  Spend less time sitting. Even light physical activity can be beneficial. Watch cholesterol and blood lipids Have your blood tested for lipids and cholesterol at 51 years of age, then have this test every 5 years. Have your cholesterol levels checked more often if:  Your lipid or cholesterol levels are high.  You are older than 51 years of age.  You are at high risk for heart disease. What should I know about cancer screening? Depending on your health history and family history, you may need to have cancer screening at various ages. This may include screening for:  Breast cancer.  Cervical cancer.  Colorectal cancer.  Skin cancer.  Lung cancer. What should I know about heart  disease, diabetes, and high blood pressure? Blood pressure and heart disease  High blood pressure causes heart disease and increases the risk of stroke. This is more likely to develop in people who have high blood pressure readings, are of African descent, or are overweight.  Have your blood pressure checked: ? Every 3-5 years if you are 41-32 years of age. ? Every year if you are 57 years old or older. Diabetes Have regular diabetes screenings. This checks your fasting blood sugar level. Have the screening done:  Once every three years after age 43 if you are at a normal weight and have a low risk for diabetes.  More often and at a younger age if you are overweight or have a high risk for diabetes. What should I know about preventing infection? Hepatitis B If you have a higher risk for hepatitis B, you should be screened for this virus. Talk with your health care provider to find out if you are at risk for hepatitis B infection. Hepatitis C Testing is recommended for:  Everyone born from [birth year] through 1966.  Anyone with known risk factors for hepatitis C. Sexually transmitted infections (STIs)  Get screened for STIs, including gonorrhea and chlamydia, if: ? You are sexually active and are younger than 51 years of age. ? You are older than 51 years of age and your health care provider tells you that you are at risk for this type of infection. ? Your sexual activity has changed since you were last screened, and you are at increased risk for chlamydia  or gonorrhea. Ask your health care provider if you are at risk.  Ask your health care provider about whether you are at high risk for HIV. Your health care provider may recommend a prescription medicine to help prevent HIV infection. If you choose to take medicine to prevent HIV, you should first get tested for HIV. You should then be tested every 3 months for as long as you are taking the medicine. Pregnancy  If you are about to stop  having your period (premenopausal) and you may become pregnant, seek counseling before you get pregnant.  Take 400 to 800 micrograms (mcg) of folic acid every day if you become pregnant.  Ask for birth control (contraception) if you want to prevent pregnancy. Osteoporosis and menopause Osteoporosis is a disease in which the bones lose minerals and strength with aging. This can result in bone fractures. If you are 91 years old or older, or if you are at risk for osteoporosis and fractures, ask your health care provider if you should:  Be screened for bone loss.  Take a calcium or vitamin D supplement to lower your risk of fractures.  Be given hormone replacement therapy (HRT) to treat symptoms of menopause. Follow these instructions at home: Lifestyle  Do not use any products that contain nicotine or tobacco, such as cigarettes, e-cigarettes, and chewing tobacco. If you need help quitting, ask your health care provider.  Do not use street drugs.  Do not share needles.  Ask your health care provider for help if you need support or information about quitting drugs. Alcohol use  Do not drink alcohol if: ? Your health care provider tells you not to drink. ? You are pregnant, may be pregnant, or are planning to become pregnant.  If you drink alcohol: ? Limit how much you use to 0-1 drink a day. ? Limit intake if you are breastfeeding.  Be aware of how much alcohol is in your drink. In the [place] one drink equals one 12 oz bottle of beer (355 mL), one 5 oz glass of wine (148 mL), or one 1 oz glass of hard liquor (44 mL). General instructions  Schedule regular health, dental, and eye exams.  Stay current with your vaccines.  Tell your health care provider if: ? You often feel depressed. ? You have ever been abused or do not feel safe at home. Summary  Adopting a healthy lifestyle and getting preventive care are important in promoting health and wellness.  Follow your health  care provider's instructions about healthy diet, exercising, and getting tested or screened for diseases.  Follow your health care provider's instructions on monitoring your cholesterol and blood pressure. This information is not intended to replace advice given to you by your health care provider. Make sure you discuss any questions you have with your health care provider. Document Revised: 03/29/2018 Document Reviewed: 03/29/2018 Elsevier Patient Education  [employer].   If you have lab work done today you will be contacted with your lab results within the next 2 weeks.  If you have not heard from [place] then please contact us. The fastest way to get your results is to register for My Chart.   IF you received an x-ray today, you will receive an invoice from [facility]. Please contact [facility] at [phone] with questions or concerns regarding your invoice.   IF you received labwork today, you will receive an invoice from [facility]. Please contact [facility] at [phone] with questions or concerns regarding your  invoice.   Our billing staff will not be able to assist you with questions regarding bills from these companies.  You will be contacted with the lab results as soon as they are available. The fastest way to get your results is to activate your My Chart account. Instructions are located on the last page of this paperwork. If you have not heard from [place] regarding the results in 2 weeks, please contact this office.

## 2020-04-24 NOTE — Progress Notes (Signed)
1/6/202211:41 AM  Laurie Bond 1971-04-19, 50 y.o., female [id number]  Chief Complaint  Patient presents with  . urinary problem    Noticing pink urine w/ tissue or blood clots    HPI:   Patient is a 50 y.o. female with past medical history significant for GERD, depression, HLD who presents today for hematuria.  Takes birth control pills daily without breaks Has been 7-8 years since has had a period Noticed urine was pink this past week and blood clots Discussed with GYN:  5 or 6 years ago had blood in her urine   BP Readings from Last 3 Encounters:  04/24/20 (!) 152/94  04/03/20 (!) 146/80  09/14/19 124/80   Lab Results  Component Value Date   CHOL 232 (H) 04/03/2020   HDL 59 04/03/2020   LDLCALC 143 (H) 04/03/2020   TRIG 169 (H) 04/03/2020   CHOLHDL 3.9 04/03/2020   The 10-year ASCVD risk score Denman George DC Jr., et al., 2013) is: 2.1%   Values used to calculate the score:     Age: 31 years     Sex: Female     Is Non-Hispanic African American: No     Diabetic: No     Tobacco smoker: No     Systolic Blood Pressure: 152 mmHg     Is BP treated: No     HDL Cholesterol: 59 mg/dL     Total Cholesterol: 232 mg/dL   Depression screen [facility] 2/9 04/24/2020 04/03/2020 10/30/2018  Decreased Interest 0 0 0  Down, Depressed, Hopeless 0 0 1  PHQ - 2 Score 0 0 1    Fall Risk  04/24/2020 04/03/2020 10/30/2018 10/30/2018 03/07/2018  Falls in the past year? 0 0 0 0 0  Number falls in past yr: 0 0 0 0 -  Injury with Fall? 0 0 0 0 -  Follow up Falls evaluation completed Falls evaluation completed Falls evaluation completed Falls evaluation completed -     No Active Allergies  Prior to Admission medications   Medication Sig Start Date End Date Taking? Authorizing Provider  FLUoxetine (PROZAC) 40 MG capsule TAKE 1 CAPSULE(40 MG) BY MOUTH DAILY 04/03/20  Yes Shade Flood, MD  norethindrone-ethinyl estradiol (GILDESS 1/20) 1-20 MG-MCG tablet Take 1 tablet by mouth daily.  Continuous use because of Dysmenorrhea 09/14/19  Yes Genia Del, MD  omeprazole (PRILOSEC) 20 MG capsule TAKE 1 CAPSULE(20 MG) BY MOUTH DAILY 04/03/20  Yes Shade Flood, MD  traZODone (DESYREL) 50 MG tablet TAKE 1 TABLET(50 MG) BY MOUTH AT BEDTIME 04/03/20  Yes Shade Flood, MD    Past Medical History:  Diagnosis Date  . Cholecystitis with cholelithiasis 03/15/2015  . Depression   . Dyspepsia and disorder of function of stomach 01/16/2014   Mild reflux symptoms, effectively reduced with Prilosec.   Marland Kitchen Gestational diabetes 2010   2nd of 2 pregnancies  . Hyperlipidemia   . Obesity   . Pre-eclampsia 2004  . Pre-eclampsia 2004   1st of 2 pregnancies    Past Surgical History:  Procedure Laterality Date  . LAPAROSCOPIC CHOLECYSTECTOMY    . TUBAL LIGATION    . WISDOM TOOTH EXTRACTION      Social History   Tobacco Use  . Smoking status: Never Smoker  . Smokeless tobacco: Never Used  Substance Use Topics  . Alcohol use: Yes    Alcohol/week: 0.0 - 1.0 standard drinks    Comment: once a week     Family History  Problem  Relation Age of Onset  . Cancer Mother        skin cancer  . Hypertension Mother   . Deep vein thrombosis Father   . Cancer Father        prostate  . Hypertension Father   . Pulmonary embolism Father   . Alcohol abuse Sister     Review of Systems  Constitutional: Negative for chills, fever and malaise/fatigue.  Eyes: Negative for blurred vision and double vision.  Respiratory: Negative for cough, shortness of breath and wheezing.   Cardiovascular: Negative for chest pain, palpitations and leg swelling.  Gastrointestinal: Negative for abdominal pain, blood in stool, constipation, diarrhea, heartburn, nausea and vomiting.  Genitourinary: Positive for frequency. Negative for dysuria, flank pain, hematuria and urgency.  Musculoskeletal: Negative for back pain and joint pain.  Skin: Negative for rash.  Neurological: Negative for dizziness,  weakness and headaches.     OBJECTIVE:  Today's Vitals   04/24/20 1044 04/24/20 1102  BP: (!) 151/82 (!) 152/94  Pulse: 78   Temp: 98.3 F (36.8 C)   SpO2: 97%   Weight: 200 lb (90.7 kg)   Height: 5\' 7"  (1.702 m)    Body mass index is 31.32 kg/m.   Physical Exam Constitutional:      General: She is not in acute distress.    Appearance: Normal appearance. She is not ill-appearing.  HENT:     Head: Normocephalic.  Cardiovascular:     Rate and Rhythm: Normal rate and regular rhythm.     Pulses: Normal pulses.     Heart sounds: Normal heart sounds. No murmur heard. No friction rub. No gallop.   Pulmonary:     Effort: Pulmonary effort is normal. No respiratory distress.     Breath sounds: Normal breath sounds. No stridor. No wheezing, rhonchi or rales.  Abdominal:     General: Bowel sounds are normal. There is no distension.     Palpations: Abdomen is soft.     Tenderness: There is no abdominal tenderness. There is no right CVA tenderness, left CVA tenderness, guarding or rebound.  Musculoskeletal:     Right lower leg: No edema.     Left lower leg: No edema.  Skin:    General: Skin is warm and dry.  Neurological:     Mental Status: She is alert and oriented to person, place, and time.  Psychiatric:        Mood and Affect: Mood normal.        Behavior: Behavior normal.     Results for orders placed or performed in visit on 04/24/20 (from the past 24 hour(s))  POCT urinalysis dipstick     Status: Abnormal   Collection Time: 04/24/20 11:04 AM  Result Value Ref Range   Color, UA yellow yellow   Clarity, UA clear clear   Glucose, UA negative negative mg/dL   Bilirubin, UA negative negative   Ketones, POC UA negative negative mg/dL   Spec Grav, UA 0.981 1.914 - 1.025   Blood, UA moderate (A) negative   pH, UA 6.5 5.0 - 8.0   Protein Ur, POC negative negative mg/dL   Urobilinogen, UA 0.2 0.2 or 1.0 E.U./dL   Nitrite, UA Negative Negative   Leukocytes, UA Negative  Negative  POCT Wet + KOH Prep     Status: Abnormal   Collection Time: 04/24/20 11:19 AM  Result Value Ref Range   Yeast by KOH Absent Absent   Yeast by wet prep Absent Absent  WBC by wet prep None (A) Few   Clue Cells Wet Prep HPF POC None None   Trich by wet prep Absent Absent   Bacteria Wet Prep HPF POC Few Few   Epithelial Cells By [employer] Pref ([facility]) Few None, Few, Too numerous to count   RBC,UR,HPF,POC Few (A) None RBC/hpf  POCT Microscopic Urinalysis ([facility])     Status: Abnormal   Collection Time: 04/24/20 11:30 AM  Result Value Ref Range   WBC,UR,HPF,POC None None WBC/hpf   RBC,UR,HPF,POC Few (A) None RBC/hpf   Bacteria None None, Too numerous to count   Mucus Absent Absent   Epithelial Cells, UR Per Microscopy None None, Too numerous to count cells/hpf    No results found.   ASSESSMENT and PLAN  Problem List Items Addressed This Visit      Other   Hyperlipidemia (Chronic)    Other Visit Diagnoses    Hematuria, unspecified type    -  Primary   Relevant Orders   POCT Wet + KOH Prep (Completed)   POCT urinalysis dipstick (Completed)   Urine Culture   POCT Microscopic Urinalysis ([facility]) (Completed)   Essential hypertension          Believes this may be menstruation  Declined further follow up at this time to urology or gyn  Will follow up with urine culture, declines antibiotics at this time  Declined HTN and HLD medications at this time,  Encouraged to take BP daily at home with a goal < 130/80   Return if symptoms worsen or fail to improve, for next scheduled appointment.    Huston Foley Anaia Frith, FNP-BC [facility] [place], [place] [address] Ph.  [phone] Fax [phone]

## 2020-04-25 LAB — URINE CULTURE

## 2020-07-21 ENCOUNTER — Encounter: Payer: Self-pay | Admitting: Family Medicine

## 2020-09-13 ENCOUNTER — Other Ambulatory Visit: Payer: Self-pay | Admitting: Obstetrics & Gynecology

## 2020-09-16 NOTE — Telephone Encounter (Signed)
Medication refill request: loestrin   Last AEX:  09-14-19 ML  Next AEX: 11-20-20  Last MMG (if hormonal medication request): 12-31-19 density C/BIRADS 1 negative  Refill authorized: Today, please advise.   Medication pended for #84, 0RF. Please refill if appropriate.

## 2020-09-17 HISTORY — PX: CATARACT EXTRACTION: SUR2

## 2020-10-02 ENCOUNTER — Ambulatory Visit: Payer: 59 | Admitting: Family Medicine

## 2020-10-02 ENCOUNTER — Encounter: Payer: Self-pay | Admitting: Family Medicine

## 2020-10-02 ENCOUNTER — Other Ambulatory Visit: Payer: Self-pay

## 2020-10-02 VITALS — BP 128/82 | HR 82 | Temp 98.2°F | Resp 16 | Ht 67.0 in | Wt 198.2 lb

## 2020-10-02 DIAGNOSIS — E785 Hyperlipidemia, unspecified: Secondary | ICD-10-CM | POA: Diagnosis not present

## 2020-10-02 DIAGNOSIS — F32A Depression, unspecified: Secondary | ICD-10-CM

## 2020-10-02 DIAGNOSIS — R030 Elevated blood-pressure reading, without diagnosis of hypertension: Secondary | ICD-10-CM | POA: Diagnosis not present

## 2020-10-02 DIAGNOSIS — K219 Gastro-esophageal reflux disease without esophagitis: Secondary | ICD-10-CM | POA: Diagnosis not present

## 2020-10-02 DIAGNOSIS — G4700 Insomnia, unspecified: Secondary | ICD-10-CM | POA: Diagnosis not present

## 2020-10-02 LAB — COMPREHENSIVE METABOLIC PANEL
ALT: 11 U/L (ref 0–35)
AST: 14 U/L (ref 0–37)
Albumin: 4.4 g/dL (ref 3.5–5.2)
Alkaline Phosphatase: 67 U/L (ref 39–117)
BUN: 11 mg/dL (ref 6–23)
CO2: 23 mEq/L (ref 19–32)
Calcium: 9.4 mg/dL (ref 8.4–10.5)
Chloride: 102 mEq/L (ref 96–112)
Creatinine, Ser: 0.8 mg/dL (ref 0.40–1.20)
GFR: 85.29 mL/min (ref >60.00)
Glucose, Bld: 84 mg/dL (ref 70–99)
Potassium: 4.5 mEq/L (ref 3.5–5.1)
Sodium: 136 mEq/L (ref 135–145)
Total Bilirubin: 0.5 mg/dL (ref 0.2–1.2)
Total Protein: 7.3 g/dL (ref 6.0–8.3)

## 2020-10-02 LAB — LIPID PANEL
Cholesterol: 260 mg/dL — ABNORMAL HIGH (ref 0–200)
HDL: 62.6 mg/dL (ref >39.00)
NonHDL: 196.91
Total CHOL/HDL Ratio: 4
Triglycerides: 201 mg/dL — ABNORMAL HIGH (ref 0.0–149.0)
VLDL: 40.2 mg/dL — ABNORMAL HIGH (ref 0.0–40.0)

## 2020-10-02 LAB — LDL CHOLESTEROL, DIRECT: Direct LDL: 185 mg/dL

## 2020-10-02 MED ORDER — FLUOXETINE HCL 40 MG PO CAPS: ORAL_CAPSULE | ORAL | 3 refills | Status: DC

## 2020-10-02 MED ORDER — TRAZODONE HCL 50 MG PO TABS: ORAL_TABLET | ORAL | 3 refills | Status: DC

## 2020-10-02 MED ORDER — OMEPRAZOLE 20 MG PO CPDR: DELAYED_RELEASE_CAPSULE | ORAL | 3 refills | Status: DC

## 2020-10-02 NOTE — Patient Instructions (Signed)
How to Take Your Blood Pressure Blood pressure is a measurement of how strongly your blood is pressing against the walls of your arteries. Arteries are blood vessels that carry blood from your heart throughout your body. Your health care provider takes your blood pressure at each office visit. You can also take your own blood pressure athome with a blood pressure monitor. You may need to take your own blood pressure to: Confirm a diagnosis of high blood pressure (hypertension). Monitor your blood pressure over time. Make sure your blood pressure medicine is working. Supplies needed: Blood pressure monitor. Dining room chair to sit in. Table or desk. Small notebook and pencil or pen. How to prepare To get the most accurate reading, avoid the following for 30 minutes before you check your blood pressure: Drinking caffeine. Drinking alcohol. Eating. Smoking. Exercising. Five minutes before you check your blood pressure: Use the bathroom and urinate so that you have an empty bladder. Sit quietly in a dining room chair. Do not sit in a soft couch or an armchair. Do not talk. How to take your blood pressure To check your blood pressure, follow the instructions in the manual that came with your blood pressure monitor. If you have a digital blood pressure monitor, the instructions may be as follows: Sit up straight in a chair. Place your feet on the floor. Do not cross your ankles or legs. Rest your left arm at the level of your heart on a table or desk or on the arm of a chair. Pull up your shirt sleeve. Wrap the blood pressure cuff around the upper part of your left arm, 1 inch (2.5 cm) above your elbow. It is best to wrap the cuff around bare skin. Fit the cuff snugly around your arm. You should be able to place only one finger between the cuff and your arm. Position the cord so that it rests in the bend of your elbow. Press the power button. Sit quietly while the cuff inflates and  deflates. Read the digital reading on the monitor screen and write the numbers down (record them) in a notebook. Wait 2-3 minutes, then repeat the steps, starting at step 1. What does my blood pressure reading mean? A blood pressure reading consists of a higher number over a lower number. Ideally, your blood pressure should be below 120/80. The first ("top") number is called the systolic pressure. It is a measure of the pressure in your arteries as your heart beats. The second ("bottom") number is called the diastolic pressure. It is a measure of the pressure in your arteries as theheart relaxes. Blood pressure is classified into five stages. The following are the stages for adults who do not have a short-term serious illness or a chronic condition. Systolic pressure and diastolic pressure are measured in a unit called mm Hg (millimeters of mercury). Normal Systolic pressure: below 694. Diastolic pressure: below 80. Elevated Systolic pressure: 854-627. Diastolic pressure: below 80. Hypertension stage 1 Systolic pressure: 035-009. Diastolic pressure: 38-18. Hypertension stage 2 Systolic pressure: 299 or above. Diastolic pressure: 90 or above. You can have elevated blood pressure or hypertension even if only the systolicor only the diastolic number in your reading is higher than normal. Follow these instructions at home: Medicines Take over-the-counter and prescription medicines only as told by your health care provider. Tell your health care provider if you are having any side effects from blood pressure medicine. General instructions Check your blood pressure as often as recommended by your health  care provider. Check your blood pressure at the same time every day. Take your monitor to the next appointment with your health care provider to make sure that: You are using it correctly. It provides accurate readings. Understand what your goal blood pressure numbers are. Keep all follow-up  visits as told by your health care provider. This is important. General tips Your health care provider can suggest a reliable monitor that will meet your needs. There are several types of home blood pressure monitors. Choose a monitor that has an arm cuff. Do not choose a monitor that measures your blood pressure from your wrist or finger. Choose a cuff that wraps snugly around your upper arm. You should be able to fit only one finger between your arm and the cuff. You can buy a blood pressure monitor at most drugstores or online. Where to find more information American Heart Association: www.heart.org Contact a health care provider if: Your blood pressure is consistently high. Your blood pressure is suddenly low. Get help right away if: Your systolic blood pressure is higher than 180. Your diastolic blood pressure is higher than 120. Summary Blood pressure is a measurement of how strongly your blood is pressing against the walls of your arteries. A blood pressure reading consists of a higher number over a lower number. Ideally, your blood pressure should be below 120/80. Check your blood pressure at the same time every day. Avoid caffeine, alcohol, smoking, and exercise for 30 minutes prior to checking your blood pressure. These agents can affect the accuracy of the blood pressure reading. This information is not intended to replace advice given to you by your health care provider. Make sure you discuss any questions you have with your healthcare provider. Document Revised: 02/13/2020 Document Reviewed: 03/30/2019 Elsevier Patient Education  2022 [employer].

## 2020-10-02 NOTE — Progress Notes (Signed)
Subjective:  Patient ID: Laurie Bond, female    DOB: 1971-03-17  Age: 50 y.o. MRN: [id number]  CC:  Chief Complaint  Patient presents with   Hyperlipidemia    Due for recheck today with labs, no concerns at this time.    Hypertension    Pt concerned her BP has been slowly elevating, no physical sxs at this time, pt BP in office is normal with manual cuff, pt cuff reads 166/100    Gastroesophageal Reflux    Pt also in need of refill prilosec today no issues with reflux at this time    Insomnia    Pt reports trazodone works well, in need of refill today no other concerns,     HPI [name] presents for   Depression with insomnia: Treated with fluoxetine 40 mg daily, trazodone 50 mg nightly.  Situational/family stresses, has met with therapist along with her daughter.  Improved at last visit [facility] [place].  Still doing ok. Therapy worked well with dtr. Things are going well. No new side effects.   Depression screen [facility] 2/9 10/02/2020 04/24/2020 04/03/2020 10/30/2018 10/30/2018  Decreased Interest 0 0 0 0 0  Down, Depressed, Hopeless 0 0 0 1 0  PHQ - 2 Score 0 0 0 1 0  Altered sleeping 0 - - - -  Tired, decreased energy 0 - - - -  Change in appetite 0 - - - -  Feeling bad or failure about yourself  0 - - - -  Trouble concentrating 0 - - - -  Moving slowly or fidgety/restless 0 - - - -  Suicidal thoughts 0 - - - -  PHQ-9 Score 0 - - - -   GERD Treated with omeprazole 20 mg daily.  GI, Dr. Silverio Decamp, endoscopy in 2020 with gastritis, no PUD. QD dosing  Hyperlipidemia: Elevated readings at physical in December, no current meds.  Diet/exercise approach planned.  Weight down 2 pounds from January, up 2 pounds from December. Min changes in diet/exercise.  Walking 3-4 time per week.  Lab Results  Component Value Date   CHOL 232 (H) 04/03/2020   HDL 59 04/03/2020   LDLCALC 143 (H) 04/03/2020   TRIG 169 (H) 04/03/2020   CHOLHDL  3.9 04/03/2020   Lab Results  Component Value Date   ALT 13 04/03/2020   AST 15 04/03/2020   ALKPHOS 71 04/03/2020   BILITOT 0.3 04/03/2020   Wt Readings from Last 3 Encounters:  10/02/20 198 lb 3.2 oz (89.9 kg)  04/24/20 200 lb (90.7 kg)  04/03/20 196 lb 3.2 oz (89 kg)   Elevated blood pressures: Home blood pressure cuff has been reading some elevated readings.  Did bring into office today as above with much higher reading on her cuff compared to in office.  However did have some elevated readings in December and January. No CP/dyspnea/lightheadedness.  150-160/90-100.  BP 130/80 with cataract surgery.  BP Readings from Last 3 Encounters:  10/02/20 128/82  04/24/20 (!) 152/94  04/03/20 (!) 146/80    History Patient Active Problem List   Diagnosis Date Noted   Insomnia 01/27/2016   Hyperlipidemia 03/07/2012   Depression 03/07/2012   Immune to varicella 03/07/2006   Past Medical History:  Diagnosis Date   Cholecystitis with cholelithiasis 03/15/2015   Depression    Dyspepsia and disorder of function of stomach 01/16/2014   Mild reflux symptoms, effectively reduced with Prilosec.  Gestational diabetes 2010   2nd of 2 pregnancies   Hyperlipidemia    Obesity    Pre-eclampsia 2004   Pre-eclampsia 2004   1st of 2 pregnancies   Past Surgical History:  Procedure Laterality Date   LAPAROSCOPIC CHOLECYSTECTOMY     TUBAL LIGATION     WISDOM TOOTH EXTRACTION     No Active Allergies Prior to Admission medications   Medication Sig Start Date End Date Taking? Authorizing Provider  norethindrone-ethinyl estradiol (LOESTRIN) 1-20 MG-MCG tablet TAKE 1 TABLET BY MOUTH DAILY CONTINUOUSLY FOR DYSMENORRHEA 09/16/20   Princess Bruins, MD  FLUoxetine (PROZAC) 40 MG capsule TAKE 1 CAPSULE(40 MG) BY MOUTH DAILY 04/03/20   Wendie Agreste, MD  omeprazole (PRILOSEC) 20 MG capsule TAKE 1 CAPSULE(20 MG) BY MOUTH DAILY 04/03/20   Wendie Agreste, MD  traZODone (DESYREL) 50 MG  tablet TAKE 1 TABLET(50 MG) BY MOUTH AT BEDTIME 04/03/20   Wendie Agreste, MD   Social History   Socioeconomic History   Marital status: Married    Spouse name: Shanon Brow   Number of children: 2   Years of education: 14   Highest education level: Not on file  Occupational History   Occupation: Programmer, multimedia: [employer]  Tobacco Use   Smoking status: Never   Smokeless tobacco: Never  Vaping Use   Vaping Use: Never used  Substance and Sexual Activity   Alcohol use: Yes    Alcohol/week: 0.0 - 1.0 standard drinks    Comment: once a week    Drug use: No   Sexual activity: Yes    Partners: Male    Birth control/protection: Pill    Comment: 1st intercourse- 16, partners- 74, married- 82 yrs   Other Topics Concern   Not on file  Social History Narrative   Lives with husband and 2 children.   Social Determinants of Health   Financial Resource Strain: Not on file  Food Insecurity: Not on file  Transportation Needs: Not on file  Physical Activity: Not on file  Stress: Not on file  Social Connections: Not on file  Intimate Partner Violence: Not on file    Review of Systems  Constitutional:  Negative for fatigue and unexpected weight change.  Respiratory:  Negative for chest tightness and shortness of breath.   Cardiovascular:  Negative for chest pain, palpitations and leg swelling.  Gastrointestinal:  Negative for abdominal pain and blood in stool.  Neurological:  Negative for dizziness, syncope, light-headedness and headaches.  Psychiatric/Behavioral:  Negative for dysphoric mood and sleep disturbance.     Objective:   Vitals:   10/02/20 0823  BP: 128/82  Pulse: 82  Resp: 16  Temp: 98.2 F (36.8 C)  TempSrc: Temporal  SpO2: 96%  Weight: 198 lb 3.2 oz (89.9 kg)  Height: '5\' 7"'  (1.702 m)     Physical Exam Vitals reviewed.  Constitutional:      Appearance: Normal appearance. She is well-developed.  HENT:     Head: Normocephalic and atraumatic.   Eyes:     Conjunctiva/sclera: Conjunctivae normal.     Pupils: Pupils are equal, round, and reactive to light.  Neck:     Vascular: No carotid bruit.  Cardiovascular:     Rate and Rhythm: Normal rate and regular rhythm.     Heart sounds: Normal heart sounds.  Pulmonary:     Effort: Pulmonary effort is normal.     Breath sounds: Normal breath sounds.  Abdominal:  Palpations: Abdomen is soft. There is no pulsatile mass.     Tenderness: There is no abdominal tenderness.  Musculoskeletal:     Right lower leg: No edema.     Left lower leg: No edema.  Skin:    General: Skin is warm and dry.  Neurological:     Mental Status: She is alert and oriented to person, place, and time.  Psychiatric:        Mood and Affect: Mood normal.        Behavior: Behavior normal.       Assessment & Plan:  Calleigh Samanthamarie Ezzell is a 50 y.o. female . Gastroesophageal reflux disease, unspecified whether esophagitis present - Plan: omeprazole (PRILOSEC) 20 MG capsule  -With gastritis on prior endoscopy in 2020, no peptic ulcer disease.  Well controlled.  Option of intermittent PPI dosing.  Insomnia, unspecified type - Plan: traZODone (DESYREL) 50 MG tablet Depression, unspecified depression type - Plan: FLUoxetine (PROZAC) 40 MG capsule  -Well-controlled current regimen, continue same  Elevated blood pressure reading  -Few elevated readings noted here as well as her home monitor but that appears to be reading significantly higher when compared to in office testing.  Handout given on appropriate home monitoring with RTC precautions.  Hyperlipidemia, unspecified hyperlipidemia type  -Repeat labs, continue exercise, diet changes.  46-monthfollow-up for physical  Meds ordered this encounter  Medications   omeprazole (PRILOSEC) 20 MG capsule    Sig: TAKE 1 CAPSULE(20 MG) BY MOUTH DAILY    Dispense:  90 capsule    Refill:  3   traZODone (DESYREL) 50 MG tablet    Sig: TAKE 1 TABLET(50 MG) BY MOUTH  AT BEDTIME    Dispense:  90 tablet    Refill:  3   FLUoxetine (PROZAC) 40 MG capsule    Sig: TAKE 1 CAPSULE(40 MG) BY MOUTH DAILY    Dispense:  90 capsule    Refill:  3   Patient Instructions   How to Take Your Blood Pressure Blood pressure is a measurement of how strongly your blood is pressing against the walls of your arteries. Arteries are blood vessels that carry blood from your heart throughout your body. Your health care provider takes your blood pressure at each office visit. You can also take your own blood pressure athome with a blood pressure monitor. You may need to take your own blood pressure to: Confirm a diagnosis of high blood pressure (hypertension). Monitor your blood pressure over time. Make sure your blood pressure medicine is working. Supplies needed: Blood pressure monitor. Dining room chair to sit in. Table or desk. Small notebook and pencil or pen. How to prepare To get the most accurate reading, avoid the following for 30 minutes before you check your blood pressure: Drinking caffeine. Drinking alcohol. Eating. Smoking. Exercising. Five minutes before you check your blood pressure: Use the bathroom and urinate so that you have an empty bladder. Sit quietly in a dining room chair. Do not sit in a soft couch or an armchair. Do not talk. How to take your blood pressure To check your blood pressure, follow the instructions in the manual that came with your blood pressure monitor. If you have a digital blood pressure monitor, the instructions may be as follows: Sit up straight in a chair. Place your feet on the floor. Do not cross your ankles or legs. Rest your left arm at the level of your heart on a table or desk or on the arm of  a chair. Pull up your shirt sleeve. Wrap the blood pressure cuff around the upper part of your left arm, 1 inch (2.5 cm) above your elbow. It is best to wrap the cuff around bare skin. Fit the cuff snugly around your arm. You  should be able to place only one finger between the cuff and your arm. Position the cord so that it rests in the bend of your elbow. Press the power button. Sit quietly while the cuff inflates and deflates. Read the digital reading on the monitor screen and write the numbers down (record them) in a notebook. Wait 2-3 minutes, then repeat the steps, starting at step 1. What does my blood pressure reading mean? A blood pressure reading consists of a higher number over a lower number. Ideally, your blood pressure should be below 120/80. The first ("top") number is called the systolic pressure. It is a measure of the pressure in your arteries as your heart beats. The second ("bottom") number is called the diastolic pressure. It is a measure of the pressure in your arteries as theheart relaxes. Blood pressure is classified into five stages. The following are the stages for adults who do not have a short-term serious illness or a chronic condition. Systolic pressure and diastolic pressure are measured in a unit called mm Hg (millimeters of mercury). Normal Systolic pressure: below 416. Diastolic pressure: below 80. Elevated Systolic pressure: 606-301. Diastolic pressure: below 80. Hypertension stage 1 Systolic pressure: 601-093. Diastolic pressure: 23-55. Hypertension stage 2 Systolic pressure: 732 or above. Diastolic pressure: 90 or above. You can have elevated blood pressure or hypertension even if only the systolicor only the diastolic number in your reading is higher than normal. Follow these instructions at home: Medicines Take over-the-counter and prescription medicines only as told by your health care provider. Tell your health care provider if you are having any side effects from blood pressure medicine. General instructions Check your blood pressure as often as recommended by your health care provider. Check your blood pressure at the same time every day. Take your monitor to the next  appointment with your health care provider to make sure that: You are using it correctly. It provides accurate readings. Understand what your goal blood pressure numbers are. Keep all follow-up visits as told by your health care provider. This is important. General tips Your health care provider can suggest a reliable monitor that will meet your needs. There are several types of home blood pressure monitors. Choose a monitor that has an arm cuff. Do not choose a monitor that measures your blood pressure from your wrist or finger. Choose a cuff that wraps snugly around your upper arm. You should be able to fit only one finger between your arm and the cuff. You can buy a blood pressure monitor at most drugstores or online. Where to find more information American Heart Association: www.heart.org Contact a health care provider if: Your blood pressure is consistently high. Your blood pressure is suddenly low. Get help right away if: Your systolic blood pressure is higher than 180. Your diastolic blood pressure is higher than 120. Summary Blood pressure is a measurement of how strongly your blood is pressing against the walls of your arteries. A blood pressure reading consists of a higher number over a lower number. Ideally, your blood pressure should be below 120/80. Check your blood pressure at the same time every day. Avoid caffeine, alcohol, smoking, and exercise for 30 minutes prior to checking your blood pressure. These agents  can affect the accuracy of the blood pressure reading. This information is not intended to replace advice given to you by your health care provider. Make sure you discuss any questions you have with your healthcare provider. Document Revised: 02/13/2020 Document Reviewed: 03/30/2019 Elsevier Patient Education  2022 [place],   Merri Ray, MD [place], [facility] 10/02/20 8:57 AM

## 2020-11-20 ENCOUNTER — Other Ambulatory Visit: Payer: Self-pay

## 2020-11-20 ENCOUNTER — Other Ambulatory Visit (HOSPITAL_COMMUNITY)
Admission: RE | Admit: 2020-11-20 | Discharge: 2020-11-20 | Disposition: A | Discharge status: Home / Self Care | Payer: 59 | Source: Ambulatory Visit | Attending: Obstetrics & Gynecology | Admitting: Obstetrics & Gynecology

## 2020-11-20 ENCOUNTER — Ambulatory Visit (INDEPENDENT_AMBULATORY_CARE_PROVIDER_SITE_OTHER): Payer: 59 | Admitting: Obstetrics & Gynecology

## 2020-11-20 ENCOUNTER — Encounter: Payer: Self-pay | Admitting: Obstetrics & Gynecology

## 2020-11-20 VITALS — BP 140/82 | HR 89 | Resp 20 | Ht 67.0 in | Wt 189.2 lb

## 2020-11-20 DIAGNOSIS — Z01419 Encounter for gynecological examination (general) (routine) without abnormal findings: Secondary | ICD-10-CM | POA: Insufficient documentation

## 2020-11-20 DIAGNOSIS — R87610 Atypical squamous cells of undetermined significance on cytologic smear of cervix (ASC-US): Secondary | ICD-10-CM | POA: Insufficient documentation

## 2020-11-20 DIAGNOSIS — N946 Dysmenorrhea, unspecified: Secondary | ICD-10-CM

## 2020-11-20 DIAGNOSIS — Z9851 Tubal ligation status: Secondary | ICD-10-CM

## 2020-11-20 MED ORDER — NORETHINDRONE ACET-ETHINYL EST 1-20 MG-MCG PO TABS: 1.0000 | ORAL_TABLET | Freq: Every day | ORAL | 4 refills | Status: DC

## 2020-11-20 NOTE — Progress Notes (Signed)
Laurie Bond [birth year] DP:[id number]   History:    50 y.o. G2P2L2 Married.  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   RP:  Established patient presenting for annual gyn exam   HPI:  Well on continuous BCPs for Dysmeno.  No BTB.  No pelvic pain.  No pain with IC.  Normal vaginal secretions.  Urine/BMs normal.  Breasts normal.  BMI 29.73.  Enjoys walking.  Health Labs with [facility].    Past medical history,surgical history, family history and social history were all reviewed and documented in the EPIC chart.  Gynecologic History No LMP recorded. (Menstrual status: Oral contraceptives).  Obstetric History OB History  Gravida Para Term Preterm AB Living  '2 2       2  '$ SAB IAB Ectopic Multiple Live Births               # Outcome Date GA Lbr Len/2nd Weight Sex Delivery Anes PTL Lv  2 Para           1 Para              ROS: A ROS was performed and pertinent positives and negatives are included in the history.  GENERAL: No fevers or chills. HEENT: No change in vision, no earache, sore throat or sinus congestion. NECK: No pain or stiffness. CARDIOVASCULAR: No chest pain or pressure. No palpitations. PULMONARY: No shortness of breath, cough or wheeze. GASTROINTESTINAL: No abdominal pain, nausea, vomiting or diarrhea, melena or bright red blood per rectum. GENITOURINARY: No urinary frequency, urgency, hesitancy or dysuria. MUSCULOSKELETAL: No joint or muscle pain, no back pain, no recent trauma. DERMATOLOGIC: No rash, no itching, no lesions. ENDOCRINE: No polyuria, polydipsia, no heat or cold intolerance. No recent change in weight. HEMATOLOGICAL: No anemia or easy bruising or bleeding. NEUROLOGIC: No headache, seizures, numbness, tingling or weakness. PSYCHIATRIC: No depression, no loss of interest in normal activity or change in sleep pattern.     Exam:   BP 140/82 (BP Location: Right Arm, Patient Position: Sitting)    Pulse 89   Resp 20   SpO2 99%     General appearance : Well developed well nourished female. No acute distress HEENT: Eyes: no retinal hemorrhage or exudates,  Neck supple, trachea midline, no carotid bruits, no thyroidmegaly Lungs: Clear to auscultation, no rhonchi or wheezes, or rib retractions  Heart: Regular rate and rhythm, no murmurs or gallops Breast:Examined in sitting and supine position were symmetrical in appearance, no palpable masses or tenderness,  no skin retraction, no nipple inversion, no nipple discharge, no skin discoloration, no axillary or supraclavicular lymphadenopathy Abdomen: no palpable masses or tenderness, no rebound or guarding Extremities: no edema or skin discoloration or tenderness  Pelvic: Vulva: Normal             Vagina: No gross lesions or discharge  Cervix: No gross lesions or discharge.  Pap reflex done.  Uterus  AV, normal size, shape and consistency, non-tender and mobile  Adnexa  Without masses or tenderness  Anus: Normal   Assessment/Plan:  50 y.o. female for annual exam   1. Encounter for routine gynecological examination with Papanicolaou smear of cervix Normal gynecologic exam.  Pap reflex done, ASCUS/HPV HR Neg in 2021.  Breasts normal.  Screening mammo 12/2019 Neg.  [place] 2020.  Health labs with Fam MD.   - Cytology - PAP  2. ASCUS of cervix with negative  high risk HPV - Cytology - PAP  3. S/P tubal ligation  4. Dysmenorrhea Well on continuous BCPs. No CI to continue.  Prescription sent to pharmacy.  Other orders - norethindrone-ethinyl estradiol (LOESTRIN) 1-20 MG-MCG tablet; Take 1 tablet by mouth daily. Continuous use.   Princess Bruins MD, 3:07 PM 11/20/2020

## 2020-11-21 ENCOUNTER — Encounter: Payer: Self-pay | Admitting: Obstetrics & Gynecology

## 2020-11-25 LAB — CYTOLOGY - PAP: Diagnosis: NEGATIVE

## 2020-12-07 ENCOUNTER — Other Ambulatory Visit: Payer: Self-pay | Admitting: Obstetrics & Gynecology

## 2021-01-14 ENCOUNTER — Other Ambulatory Visit: Payer: Self-pay | Admitting: Family Medicine

## 2021-01-14 DIAGNOSIS — F32A Depression, unspecified: Secondary | ICD-10-CM

## 2021-01-14 DIAGNOSIS — G4700 Insomnia, unspecified: Secondary | ICD-10-CM

## 2021-01-20 ENCOUNTER — Other Ambulatory Visit: Payer: Self-pay | Admitting: Critical Care Medicine

## 2021-01-20 DIAGNOSIS — Z1231 Encounter for screening mammogram for malignant neoplasm of breast: Secondary | ICD-10-CM

## 2021-02-16 ENCOUNTER — Ambulatory Visit
Admission: RE | Admit: 2021-02-16 | Discharge: 2021-02-16 | Disposition: A | Discharge status: Home / Self Care | Payer: 59 | Source: Ambulatory Visit

## 2021-02-16 ENCOUNTER — Other Ambulatory Visit: Payer: Self-pay

## 2021-02-16 DIAGNOSIS — Z1231 Encounter for screening mammogram for malignant neoplasm of breast: Secondary | ICD-10-CM

## 2021-04-09 ENCOUNTER — Ambulatory Visit (INDEPENDENT_AMBULATORY_CARE_PROVIDER_SITE_OTHER): Payer: 59 | Admitting: Family Medicine

## 2021-04-09 ENCOUNTER — Encounter: Payer: Self-pay | Admitting: Family Medicine

## 2021-04-09 VITALS — BP 128/78 | HR 70 | Temp 98.3°F | Resp 16 | Wt 178.2 lb

## 2021-04-09 DIAGNOSIS — G4700 Insomnia, unspecified: Secondary | ICD-10-CM

## 2021-04-09 DIAGNOSIS — F32A Depression, unspecified: Secondary | ICD-10-CM

## 2021-04-09 DIAGNOSIS — K219 Gastro-esophageal reflux disease without esophagitis: Secondary | ICD-10-CM | POA: Diagnosis not present

## 2021-04-09 DIAGNOSIS — E785 Hyperlipidemia, unspecified: Secondary | ICD-10-CM | POA: Diagnosis not present

## 2021-04-09 DIAGNOSIS — Z130 Encounter for screening for diseases of the blood and blood-forming organs and certain disorders involving the immune mechanism: Secondary | ICD-10-CM | POA: Diagnosis not present

## 2021-04-09 DIAGNOSIS — Z1329 Encounter for screening for other suspected endocrine disorder: Secondary | ICD-10-CM

## 2021-04-09 DIAGNOSIS — Z0000 Encounter for general adult medical examination without abnormal findings: Secondary | ICD-10-CM

## 2021-04-09 LAB — COMPREHENSIVE METABOLIC PANEL
ALT: 15 U/L (ref 0–35)
AST: 14 U/L (ref 0–37)
Albumin: 4.1 g/dL (ref 3.5–5.2)
Alkaline Phosphatase: 58 U/L (ref 39–117)
BUN: 11 mg/dL (ref 6–23)
CO2: 25 mEq/L (ref 19–32)
Calcium: 8.9 mg/dL (ref 8.4–10.5)
Chloride: 103 mEq/L (ref 96–112)
Creatinine, Ser: 0.75 mg/dL (ref 0.40–1.20)
GFR: 91.83 mL/min (ref >60.00)
Glucose, Bld: 81 mg/dL (ref 70–99)
Potassium: 4.2 mEq/L (ref 3.5–5.1)
Sodium: 135 mEq/L (ref 135–145)
Total Bilirubin: 0.5 mg/dL (ref 0.2–1.2)
Total Protein: 6.9 g/dL (ref 6.0–8.3)

## 2021-04-09 LAB — CBC
HCT: 38.4 % (ref 36.0–46.0)
Hemoglobin: 13 g/dL (ref 12.0–15.0)
MCHC: 33.7 g/dL (ref 30.0–36.0)
MCV: 85.9 fl (ref 78.0–100.0)
Platelets: 255 10*3/uL (ref 150.0–400.0)
RBC: 4.47 Mil/uL (ref 3.87–5.11)
RDW: 12.9 % (ref 11.5–15.5)
WBC: 6.8 10*3/uL (ref 4.0–10.5)

## 2021-04-09 LAB — LIPID PANEL
Cholesterol: 210 mg/dL — ABNORMAL HIGH (ref 0–200)
HDL: 48.3 mg/dL (ref >39.00)
NonHDL: 162.08
Total CHOL/HDL Ratio: 4
Triglycerides: 245 mg/dL — ABNORMAL HIGH (ref 0.0–149.0)
VLDL: 49 mg/dL — ABNORMAL HIGH (ref 0.0–40.0)

## 2021-04-09 LAB — LDL CHOLESTEROL, DIRECT: Direct LDL: 153 mg/dL

## 2021-04-09 LAB — TSH: TSH: 0.95 u[IU]/mL (ref 0.35–5.50)

## 2021-04-09 MED ORDER — FLUOXETINE HCL 40 MG PO CAPS: ORAL_CAPSULE | ORAL | 3 refills | Status: DC

## 2021-04-09 MED ORDER — TRAZODONE HCL 50 MG PO TABS: ORAL_TABLET | ORAL | 3 refills | Status: DC

## 2021-04-09 NOTE — Progress Notes (Signed)
Subjective:  Patient ID: Laurie Bond, female    DOB: Apr 06, 1971  Age: 50 y.o. MRN: [id number]  CC:  Chief Complaint  Patient presents with   Annual Exam    HPI Laurie Bond presents for  Annual physical exam Also followed by OB/GYN, Dr. Dellis Filbert, with appt August 4.  Pap testing at that time negative.  Hyperlipidemia: Diet exercise approach planned.  Low ASCVD risk score.  Weight has improved since last visit had been losing weight at that time. Form of keto. Swapping carbs for veggies. Healthier snacks.  Exercising. No current meds.  Blood pressure  also borderline previously.  Normal today. Fasting today.  Wt Readings from Last 3 Encounters:  04/09/21 178 lb 3.2 oz (80.8 kg)  11/20/20 189 lb 3.2 oz (85.8 kg)  10/02/20 198 lb 3.2 oz (89.9 kg)    Lab Results  Component Value Date   CHOL 260 (H) 10/02/2020   HDL 62.60 10/02/2020   LDLCALC 143 (H) 04/03/2020   LDLDIRECT 185.0 10/02/2020   TRIG 201.0 (H) 10/02/2020   CHOLHDL 4 10/02/2020   Lab Results  Component Value Date   ALT 11 10/02/2020   AST 14 10/02/2020   ALKPHOS 67 10/02/2020   BILITOT 0.5 10/02/2020   GERD Omeprazole 20 mg daily.  GI Dr. Silverio Decamp.  Endoscopy in 2020 with gastritis but no PUD. Has cut back on PPI - not needed recently and no recurrence off meds.   Depression: With insomnia.  Treated with fluoxetine 40 mg daily, trazodone 50 mg nightly.  Has no therapist previously including therapy with her daughter.  Situational/family stressors previously.  Doing well. Family doing well - back from vacation, son 6yo - went to [place], drove to [place], [place]. [place].   Depression screen [facility] 2/9 04/09/2021 10/02/2020 04/24/2020 04/03/2020 10/30/2018  Decreased Interest 0 0 0 0 0  Down, Depressed, Hopeless 0 0 0 0 1  PHQ - 2 Score 0 0 0 0 1  Altered sleeping 0 0 - - -  Tired, decreased energy 0 0 - - -  Change in appetite 0 0 - - -  Feeling bad or failure about  yourself  0 0 - - -  Trouble concentrating 0 0 - - -  Moving slowly or fidgety/restless 0 0 - - -  Suicidal thoughts 0 0 - - -  PHQ-9 Score 0 0 - - -  Difficult doing work/chores Not difficult at all - - - -   Cancer screening Colonoscopy: January 2020 single 5 mm polyp, serrated polyp with features of sessile serrated polyp.  Mild diverticulosis.  Nonbleeding internal hemorrhoids. Pap - August 4, negative Mammogram without evidence of malignancy 02/16/2021.  Immunization History  Administered Date(s) Administered   Hepatitis A 01/25/2006, 08/23/2006   Hepatitis B 03/02/2006, 04/21/2006, 08/23/2006   Influenza Split 01/31/2008, 12/27/2008, 01/21/2010, 01/18/2012, 01/17/2013   Influenza,inj,Quad PF,6+ Mos 02/04/2017   Influenza-Unspecified 01/15/2014, 02/16/2020, 03/03/2021, 03/31/2021   MMR [birth year]1970, 01/24/1990   Moderna Sars-Covid-2 Vaccination 04/18/2019, 05/16/2019   PFIZER(Purple Top)SARS-COV-2 Vaccination 04/17/2020   PPD Test 02/18/2007, 02/10/2008, 06/18/2008, 07/08/2009, 09/17/2010   Pfizer Covid-19 Vaccine Bivalent Booster 66yr & up 02/06/2021   Tdap 01/25/2006, 01/27/2016  Shingrix: plans on after the new year.  No results found. Single CL left eye[facility]   Dental every 6 months - Dr. MDeanna Artis Alcohol -1 glass wine every 2-3 weeks.   Tobacco - none  Exercise: walk/run on treadmill 3 days per week - 30 minutes.   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   History Patient Active Problem List   Diagnosis Date Noted   Hyperlipidemia 03/07/2012   Depression 03/07/2012   Immune to varicella 03/07/2006   Past Medical History:  Diagnosis Date   Cholecystitis with cholelithiasis 03/15/2015   Depression    Dyspepsia and disorder of function of stomach 01/16/2014   Mild reflux symptoms, effectively reduced with Prilosec.    Gestational diabetes 2010   2nd of 2 pregnancies    Hyperlipidemia    Obesity    Pre-eclampsia 2004   Pre-eclampsia 2004   1st of 2 pregnancies   Past Surgical History:  Procedure Laterality Date   CATARACT EXTRACTION Right 09/2020   LAPAROSCOPIC CHOLECYSTECTOMY     TUBAL LIGATION     WISDOM TOOTH EXTRACTION     No Known Allergies Prior to Admission medications   Medication Sig Start Date End Date Taking? Authorizing Provider  FLUoxetine (PROZAC) 40 MG capsule TAKE 1 CAPSULE(40 MG) BY MOUTH DAILY 01/16/21  Yes Wendie Agreste, MD  norethindrone-ethinyl estradiol (LOESTRIN) 1-20 MG-MCG tablet TAKE 1 TABLET EVERY DAY CONTINUOUSLY FOR DYSMENORRHEA 12/08/20  Yes Princess Bruins, MD  traZODone (DESYREL) 50 MG tablet TAKE 1 TABLET(50 MG) BY MOUTH AT BEDTIME 01/16/21  Yes Wendie Agreste, MD  omeprazole (PRILOSEC) 20 MG capsule TAKE 1 CAPSULE(20 MG) BY MOUTH DAILY Patient not taking: Reported on 11/20/2020 10/02/20   Wendie Agreste, MD   Social History   Socioeconomic History   Marital status: Married    Spouse name: Shanon Brow   Number of children: 2   Years of education: 14   Highest education level: Not on file  Occupational History   Occupation: Programmer, multimedia: [employer]: [employer]  Tobacco Use   Smoking status: Never   Smokeless tobacco: Never  Vaping Use   Vaping Use: Never used  Substance and Sexual Activity   Alcohol use: Yes    Alcohol/week: 0.0 - 1.0 standard drinks    Comment: once a week    Drug use: No   Sexual activity: Yes    Partners: Male    Birth control/protection: Pill    Comment: 1st intercourse- 16, partners- 68, married- 48 yrs   Other Topics Concern   Not on file  Social History Narrative   Lives with husband and 2 children.   Social Determinants of Health   Financial Resource Strain: Not on file  Food Insecurity: Not on file  Transportation Needs: Not on file  Physical Activity: Not on file  Stress: Not on file  Social Connections: Not on file  Intimate Partner Violence: Not on file     Review of Systems 13 point review of systems per patient health survey noted.  Negative other than as indicated above or in HPI.   Objective:   Vitals:   04/09/21 0803  BP: 128/78  Pulse: 70  Resp: 16  Temp: 98.3 F (36.8 C)  SpO2: 98%  Weight: 178 lb 3.2 oz (80.8 kg)     Physical Exam Vitals reviewed.  Constitutional:      Appearance: She is well-developed.  HENT:     Head: Normocephalic and atraumatic.     Right Ear: External ear normal.     Left Ear: External ear normal.  Eyes:     Conjunctiva/sclera: Conjunctivae normal.     Pupils: Pupils are equal, round, and  reactive to light.  Neck:     Thyroid: No thyromegaly.  Cardiovascular:     Rate and Rhythm: Normal rate and regular rhythm.     Heart sounds: Normal heart sounds. No murmur heard. Pulmonary:     Effort: Pulmonary effort is normal. No respiratory distress.     Breath sounds: Normal breath sounds. No wheezing.  Abdominal:     General: Bowel sounds are normal.     Palpations: Abdomen is soft.     Tenderness: There is no abdominal tenderness.  Musculoskeletal:        General: No tenderness. Normal range of motion.     Cervical back: Normal range of motion and neck supple.  Lymphadenopathy:     Cervical: No cervical adenopathy.  Skin:    General: Skin is warm and dry.     Findings: No rash.  Neurological:     Mental Status: She is alert and oriented to person, place, and time.  Psychiatric:        Behavior: Behavior normal.        Thought Content: Thought content normal.       Assessment & Plan:  Rahel Krishauna Schatzman is a 50 y.o. female . Annual physical exam  - -anticipatory guidance as below in AVS, screening labs above. Health maintenance items as above in HPI discussed/recommended as applicable.   Hyperlipidemia, unspecified hyperlipidemia type - Plan: Comprehensive metabolic panel, Lipid panel  -Commended on weight loss.  Anticipate significant improvement in lipids.  No meds  recommended at this time, especially with low ASCVD risk for previously.  Depression, unspecified depression type - Plan: FLUoxetine (PROZAC) 40 MG capsule Insomnia, unspecified type - Plan: traZODone (DESYREL) 50 MG tablet  -Stable along with insomnia.  Continue fluoxetine, trazodone same doses.  Recheck 1 year if stable symptoms.  Gastroesophageal reflux disease, unspecified whether esophagitis present  -Asymptomatic off meds.  Diet changes likely have helped option to restart PPI but okay to avoid for now.  Screening for thyroid disorder - Plan: TSH  Screening, anemia, deficiency, iron - Plan: CBC   Meds ordered this encounter  Medications   FLUoxetine (PROZAC) 40 MG capsule    Sig: TAKE 1 CAPSULE(40 MG) BY MOUTH DAILY    Dispense:  90 capsule    Refill:  3    Ok to place on hold.   traZODone (DESYREL) 50 MG tablet    Sig: TAKE 1 TABLET(50 MG) BY MOUTH AT BEDTIME    Dispense:  90 tablet    Refill:  3    Ok to place on hold   Patient Instructions  Great work on the weight loss! Omeprazole if needed but I am glad to hear that is doing well. If any concerns on labs I will let you know.   Thanks for coming in today. Let me know if you would like to schedule a visit for shingles vaccine.   Preventive Care 32-38 Years Old, Female Preventive care refers to lifestyle choices and visits with your health care provider that can promote health and wellness. Preventive care visits are also called wellness exams. What can I expect for my preventive care visit? Counseling Your health care provider may ask you questions about your: Medical history, including: Past medical problems. Family medical history. Pregnancy history. Current health, including: Menstrual cycle. Method of birth control. Emotional well-being. Home life and relationship well-being. Sexual activity and sexual health. Lifestyle, including: Alcohol, nicotine or tobacco, and drug use. Access to firearms. Diet,  exercise,  and sleep habits. Work and work Statistician. Sunscreen use. Safety issues such as seatbelt and bike helmet use. Physical exam Your health care provider will check your: Height and weight. These may be used to calculate your BMI (body mass index). BMI is a measurement that tells if you are at a healthy weight. Waist circumference. This measures the distance around your waistline. This measurement also tells if you are at a healthy weight and may help predict your risk of certain diseases, such as type 2 diabetes and high blood pressure. Heart rate and blood pressure. Body temperature. Skin for abnormal spots. What immunizations do I need? Vaccines are usually given at various ages, according to a schedule. Your health care provider will recommend vaccines for you based on your age, medical history, and lifestyle or other factors, such as travel or where you work. What tests do I need? Screening Your health care provider may recommend screening tests for certain conditions. This may include: Lipid and cholesterol levels. Diabetes screening. This is done by checking your blood sugar (glucose) after you have not eaten for a while (fasting). Pelvic exam and Pap test. Hepatitis B test. Hepatitis C test. HIV (human immunodeficiency virus) test. STI (sexually transmitted infection) testing, if you are at risk. Lung cancer screening. Colorectal cancer screening. Mammogram. Talk with your health care provider about when you should start having regular mammograms. This may depend on whether you have a family history of breast cancer. BRCA-related cancer screening. This may be done if you have a family history of breast, ovarian, tubal, or peritoneal cancers. Bone density scan. This is done to screen for osteoporosis. Talk with your health care provider about your test results, treatment options, and if necessary, the need for more tests. Follow these instructions at home: Eating and  drinking  Eat a diet that includes fresh fruits and vegetables, whole grains, lean protein, and low-fat dairy products. Take vitamin and mineral supplements as recommended by your health care provider. Do not drink alcohol if: Your health care provider tells you not to drink. You are pregnant, may be pregnant, or are planning to become pregnant. If you drink alcohol: Limit how much you have to 0-1 drink a day. Know how much alcohol is in your drink. In the [place] one drink equals one 12 oz bottle of beer (355 mL), one 5 oz glass of wine (148 mL), or one 1 oz glass of hard liquor (44 mL). Lifestyle Brush your teeth every morning and night with fluoride toothpaste. Floss one time each day. Exercise for at least 30 minutes 5 or more days each week. Do not use any products that contain nicotine or tobacco. These products include cigarettes, chewing tobacco, and vaping devices, such as e-cigarettes. If you need help quitting, ask your health care provider. Do not use drugs. If you are sexually active, practice safe sex. Use a condom or other form of protection to prevent STIs. If you do not wish to become pregnant, use a form of birth control. If you plan to become pregnant, see your health care provider for a prepregnancy visit. Take aspirin only as told by your health care provider. Make sure that you understand how much to take and what form to take. Work with your health care provider to find out whether it is safe and beneficial for you to take aspirin daily. Find healthy ways to manage stress, such as: Meditation, yoga, or listening to music. Journaling. Talking to a trusted person. Spending time with friends  and family. Minimize exposure to UV radiation to reduce your risk of skin cancer. Safety Always wear your seat belt while driving or riding in a vehicle. Do not drive: If you have been drinking alcohol. Do not ride with someone who has been drinking. When you are tired or  distracted. While texting. If you have been using any mind-altering substances or drugs. Wear a helmet and other protective equipment during sports activities. If you have firearms in your house, make sure you follow all gun safety procedures. Seek help if you have been physically or sexually abused. What's next? Visit your health care provider once a year for an annual wellness visit. Ask your health care provider how often you should have your eyes and teeth checked. Stay up to date on all vaccines. This information is not intended to replace advice given to you by your health care provider. Make sure you discuss any questions you have with your health care provider. Document Revised: 10/01/2020 Document Reviewed: 10/01/2020 Elsevier Patient Education  2022 [place],   Merri Ray, MD [place] 04/09/21 8:51 AM

## 2021-04-09 NOTE — Patient Instructions (Addendum)
Great work on the [employer] loss! Omeprazole if needed but I am glad to hear that is doing well. If any concerns on labs I will let you know.   Thanks for coming in today. Let me know if you would like to schedule a visit for shingles vaccine.   Preventive Care 64-51 Years Old, Female Preventive care refers to lifestyle choices and visits with your health care provider that can promote health and wellness. Preventive care visits are also called wellness exams. What can I expect for my preventive care visit? Counseling Your health care provider may ask you questions about your: Medical history, including: Past medical problems. Family medical history. Pregnancy history. Current health, including: Menstrual cycle. Method of birth control. Emotional well-being. Home life and relationship well-being. Sexual activity and sexual health. Lifestyle, including: Alcohol, nicotine or tobacco, and drug use. Access to firearms. Diet, exercise, and sleep habits. Work and work Statistician. Sunscreen use. Safety issues such as seatbelt and bike helmet use. Physical exam Your health care provider will check your: Height and weight. These may be used to calculate your BMI (body mass index). BMI is a measurement that tells if you are at a healthy weight. Waist circumference. This measures the distance around your waistline. This measurement also tells if you are at a healthy weight and may help predict your risk of certain diseases, such as type 2 diabetes and high blood pressure. Heart rate and blood pressure. Body temperature. Skin for abnormal spots. What immunizations do I need? Vaccines are usually given at various ages, according to a schedule. Your health care provider will recommend vaccines for you based on your age, medical history, and lifestyle or other factors, such as travel or where you work. What tests do I need? Screening Your health care provider may recommend screening tests for  certain conditions. This may include: Lipid and cholesterol levels. Diabetes screening. This is done by checking your blood sugar (glucose) after you have not eaten for a while (fasting). Pelvic exam and Pap test. Hepatitis B test. Hepatitis C test. HIV (human immunodeficiency virus) test. STI (sexually transmitted infection) testing, if you are at risk. Lung cancer screening. Colorectal cancer screening. Mammogram. Talk with your health care provider about when you should start having regular mammograms. This may depend on whether you have a family history of breast cancer. BRCA-related cancer screening. This may be done if you have a family history of breast, ovarian, tubal, or peritoneal cancers. Bone density scan. This is done to screen for osteoporosis. Talk with your health care provider about your test results, treatment options, and if necessary, the need for more tests. Follow these instructions at home: Eating and drinking  Eat a diet that includes fresh fruits and vegetables, whole grains, lean protein, and low-fat dairy products. Take vitamin and mineral supplements as recommended by your health care provider. Do not drink alcohol if: Your health care provider tells you not to drink. You are pregnant, may be pregnant, or are planning to become pregnant. If you drink alcohol: Limit how much you have to 0-1 drink a day. Know how much alcohol is in your drink. In the [place] one drink equals one 12 oz bottle of beer (355 mL), one 5 oz glass of wine (148 mL), or one 1 oz glass of hard liquor (44 mL). Lifestyle Brush your teeth every morning and night with fluoride toothpaste. Floss one time each day. Exercise for at least 30 minutes 5 or more days each week. Do not  use any products that contain nicotine or tobacco. These products include cigarettes, chewing tobacco, and vaping devices, such as e-cigarettes. If you need help quitting, ask your health care provider. Do not use  drugs. If you are sexually active, practice safe sex. Use a condom or other form of protection to prevent STIs. If you do not wish to become pregnant, use a form of birth control. If you plan to become pregnant, see your health care provider for a prepregnancy visit. Take aspirin only as told by your health care provider. Make sure that you understand how much to take and what form to take. Work with your health care provider to find out whether it is safe and beneficial for you to take aspirin daily. Find healthy ways to manage stress, such as: Meditation, yoga, or listening to music. Journaling. Talking to a trusted person. Spending time with friends and family. Minimize exposure to UV radiation to reduce your risk of skin cancer. Safety Always wear your seat belt while driving or riding in a vehicle. Do not drive: If you have been drinking alcohol. Do not ride with someone who has been drinking. When you are tired or distracted. While texting. If you have been using any mind-altering substances or drugs. Wear a helmet and other protective equipment during sports activities. If you have firearms in your house, make sure you follow all gun safety procedures. Seek help if you have been physically or sexually abused. What's next? Visit your health care provider once a year for an annual wellness visit. Ask your health care provider how often you should have your eyes and teeth checked. Stay up to date on all vaccines. This information is not intended to replace advice given to you by your health care provider. Make sure you discuss any questions you have with your health care provider. Document Revised: 10/01/2020 Document Reviewed: 10/01/2020 Elsevier Patient Education  [place].

## 2021-04-11 ENCOUNTER — Other Ambulatory Visit: Payer: Self-pay | Admitting: Family Medicine

## 2021-04-11 DIAGNOSIS — F32A Depression, unspecified: Secondary | ICD-10-CM

## 2021-04-11 DIAGNOSIS — G4700 Insomnia, unspecified: Secondary | ICD-10-CM

## 2021-11-24 ENCOUNTER — Ambulatory Visit (INDEPENDENT_AMBULATORY_CARE_PROVIDER_SITE_OTHER): Payer: 59 | Admitting: Obstetrics & Gynecology

## 2021-11-24 ENCOUNTER — Encounter: Payer: Self-pay | Admitting: Obstetrics & Gynecology

## 2021-11-24 VITALS — BP 120/68 | HR 82 | Ht 66.75 in | Wt 192.0 lb

## 2021-11-24 DIAGNOSIS — Z01419 Encounter for gynecological examination (general) (routine) without abnormal findings: Secondary | ICD-10-CM

## 2021-11-24 DIAGNOSIS — R87610 Atypical squamous cells of undetermined significance on cytologic smear of cervix (ASC-US): Secondary | ICD-10-CM | POA: Diagnosis not present

## 2021-11-24 DIAGNOSIS — Z9851 Tubal ligation status: Secondary | ICD-10-CM | POA: Diagnosis not present

## 2021-11-24 DIAGNOSIS — N946 Dysmenorrhea, unspecified: Secondary | ICD-10-CM | POA: Diagnosis not present

## 2021-11-24 MED ORDER — NORETHINDRONE 0.35 MG PO TABS: 1.0000 | ORAL_TABLET | Freq: Every day | ORAL | 4 refills | Status: DC

## 2021-11-24 NOTE — Progress Notes (Signed)
Laurie Bond 1971/01/27 [id number]   History:    51 y.o. G2P2L2 Married.  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   RP:  Established patient presenting for annual gyn exam   HPI:  Well on continuous BCPs for Dysmeno.  No BTB.  No pelvic pain.  Occasional hot flushes.  No pain with IC. Pap ASCUS/HPV HR Neg in 2021.  Pap Neg 11/2020. Will repeat at 2-3 years.  Normal vaginal secretions.  Urine/BMs normal.  Breasts normal. Mammo Neg 01/2021.  BMI 30.30.  Enjoys walking.  Health Labs with Fam MD.  Harriet Masson 04/2018.   Past medical history,surgical history, family history and social history were all reviewed and documented in the EPIC chart.  Gynecologic History No LMP recorded. (Menstrual status: Oral contraceptives).  Obstetric History OB History  Gravida Para Term Preterm AB Living  2 2 0 2   2  SAB IAB Ectopic Multiple Live Births               # Outcome Date GA Lbr Len/2nd Weight Sex Delivery Anes PTL Lv  2 Preterm           1 Preterm             ROS: A ROS was performed and pertinent positives and negatives are included in the history. GENERAL: No fevers or chills. HEENT: No change in vision, no earache, sore throat or sinus congestion. NECK: No pain or stiffness. CARDIOVASCULAR: No chest pain or pressure. No palpitations. PULMONARY: No shortness of breath, cough or wheeze. GASTROINTESTINAL: No abdominal pain, nausea, vomiting or diarrhea, melena or bright red blood per rectum. GENITOURINARY: No urinary frequency, urgency, hesitancy or dysuria. MUSCULOSKELETAL: No joint or muscle pain, no back pain, no recent trauma. DERMATOLOGIC: No rash, no itching, no lesions. ENDOCRINE: No polyuria, polydipsia, no heat or cold intolerance. No recent change in weight. HEMATOLOGICAL: No anemia or easy bruising or bleeding. NEUROLOGIC: No headache, seizures, numbness, tingling or weakness. PSYCHIATRIC: No depression, no loss of interest in  normal activity or change in sleep pattern.     Exam:   BP 120/68   Pulse 82   Ht 5' 6.75" (1.695 m)   Wt 192 lb (87.1 kg)   SpO2 99%   BMI 30.30 kg/m   Body mass index is 30.3 kg/m.  General appearance : Well developed well nourished female. No acute distress HEENT: Eyes: no retinal hemorrhage or exudates,  Neck supple, trachea midline, no carotid bruits, no thyroidmegaly Lungs: Clear to auscultation, no rhonchi or wheezes, or rib retractions  Heart: Regular rate and rhythm, no murmurs or gallops Breast:Examined in sitting and supine position were symmetrical in appearance, no palpable masses or tenderness,  no skin retraction, no nipple inversion, no nipple discharge, no skin discoloration, no axillary or supraclavicular lymphadenopathy Abdomen: no palpable masses or tenderness, no rebound or guarding Extremities: no edema or skin discoloration or tenderness  Pelvic: Vulva: Normal             Vagina: No gross lesions or discharge  Cervix: No gross lesions or discharge  Uterus  AV, normal size, shape and consistency, non-tender and mobile  Adnexa  Without masses or tenderness  Anus: Normal   Assessment/Plan:  51 y.o. female for annual exam   1. Well female exam with routine gynecological exam Well on continuous BCPs for Dysmeno.  No BTB.  No pelvic pain.  Occasional hot flushes.  No  pain with IC. Pap ASCUS/HPV HR Neg in 2021.  Pap Neg 11/2020. Will repeat at 2-3 years.  Normal vaginal secretions.  Urine/BMs normal.  Breasts normal. Mammo Neg 01/2021.  BMI 30.30.  Enjoys walking.  Health Labs with Fam MD.  Harriet Masson 04/2018.  2. ASCUS of cervix with negative high risk HPV Pap Neg 10/2020.  Repeat Pap at 2-3 yrs.  3. S/P tubal ligation  4. Dysmenorrhea Probably perimenopausal or menopausal.  Will switch to the Progestin only BCPs and do an [place]. - [facility]; Future  Other orders - norethindrone (MICRONOR) 0.35 MG tablet; Take 1 tablet (0.35 mg total) by mouth daily.   Princess Bruins MD, 12:09 PM 11/24/2021

## 2021-12-26 ENCOUNTER — Other Ambulatory Visit: Payer: Self-pay | Admitting: Obstetrics & Gynecology

## 2022-01-22 ENCOUNTER — Other Ambulatory Visit: Payer: Self-pay | Admitting: Critical Care Medicine

## 2022-01-22 DIAGNOSIS — Z1231 Encounter for screening mammogram for malignant neoplasm of breast: Secondary | ICD-10-CM

## 2022-01-29 ENCOUNTER — Encounter: Payer: Self-pay | Admitting: Obstetrics & Gynecology

## 2022-01-29 ENCOUNTER — Ambulatory Visit: Payer: 59 | Admitting: Obstetrics & Gynecology

## 2022-01-29 VITALS — BP 152/82 | HR 88

## 2022-01-29 DIAGNOSIS — R102 Pelvic and perineal pain: Secondary | ICD-10-CM | POA: Diagnosis not present

## 2022-01-29 DIAGNOSIS — N760 Acute vaginitis: Secondary | ICD-10-CM | POA: Diagnosis not present

## 2022-01-29 DIAGNOSIS — N898 Other specified noninflammatory disorders of vagina: Secondary | ICD-10-CM

## 2022-01-29 DIAGNOSIS — B9689 Other specified bacterial agents as the cause of diseases classified elsewhere: Secondary | ICD-10-CM

## 2022-01-29 LAB — WET PREP FOR TRICH, YEAST, CLUE

## 2022-01-29 MED ORDER — TINIDAZOLE 500 MG PO TABS: 1000.0000 mg | ORAL_TABLET | Freq: Two times a day (BID) | ORAL | 0 refills | Status: AC

## 2022-01-29 NOTE — Progress Notes (Signed)
    Laurie Bond Feb 20, 1971 [id number]        51 y.o.  [id number]   RP: Midline pelvic pain and Left pelvic tenderness x 1 week  HPI: Midline pelvic pain and Left pelvic tenderness x 1 week, improved today. Pt reports bladder pains, more specifically when bladder is full (states it can wake her up at night). Pt also reports some generalized pelvic pain/tenderness/bloating and LLQ pains that are more noticeable with touch or movements. Pt also reports feeling as if there is pressure in vagina.    OB History  Gravida Para Term Preterm AB Living  2 2 0 2   2  SAB IAB Ectopic Multiple Live Births               # Outcome Date GA Lbr Len/2nd Weight Sex Delivery Anes PTL Lv  2 Preterm           1 Preterm             Past medical history,surgical history, problem list, medications, allergies, family history and social history were all reviewed and documented in the EPIC chart.   Directed ROS with pertinent positives and negatives documented in the history of present illness/assessment and plan.  Exam:  Vitals:   01/29/22 1628  BP: (!) 152/82  Pulse: 88  SpO2: 98%   General appearance:  Normal  CVAT Neg bilaterally  Abdomen: Soft, NT.   Gynecologic exam: Vulva normal.  Speculum:  Cervix/vagina normal.  Mild vaginal secretions.  Wet prep done.  Bimanual exam:  Uterus AV, normal volume, mobile, NT.  Right adnexa NT, no mass.  Left adnexa tender, no mass felt.  U/A: Yellow clear, Pro Neg, Nit Neg, WBC 0-5, RBC 3-10, Bacteria few.  Pending U. Culture. Wet prep: Clue cells present with odor.   Assessment/Plan:  51 y.o. [id number]   1. Pelvic pain  Midline pelvic pain and Left pelvic tenderness x 1 week, improved today. Pt reports bladder pains, more specifically when bladder is full (states it can wake her up at night). Pt also reports some generalized pelvic pain/tenderness/bloating and LLQ pains that are more noticeable with touch or movements. Pt also reports feeling as if there  is pressure in vagina.  U/A minimally perturbed, will wait on U. Culture.  F/U with a Pelvic US to further investigate if Sxs persist after treating BV with Tinidazole. - Urinalysis,Complete w/RFL Culture - US Transvaginal Non-OB; Future  2. Vaginal discharge Bacterial Vaginosis confirmed by Wet prep. - WET PREP FOR TRICH, YEAST, CLUE  3. Bacterial vaginosis BV counseling done.  Will treat with Tinidazole 2 tab PO BID x 2 days.  No alcohol consumption.  Other orders - Urine Culture - REFLEXIVE URINE CULTURE - tinidazole (TINDAMAX) 500 MG tablet; Take 2 tablets (1,000 mg total) by mouth 2 (two) times daily for 2 days.   Princess Bruins MD, 4:38 PM 01/29/2022

## 2022-02-11 ENCOUNTER — Other Ambulatory Visit: Payer: Self-pay | Admitting: Family Medicine

## 2022-02-11 DIAGNOSIS — G4700 Insomnia, unspecified: Secondary | ICD-10-CM

## 2022-02-11 LAB — URINALYSIS, COMPLETE W/RFL CULTURE
Bilirubin Urine: NEGATIVE
Glucose, UA: NEGATIVE
Hyaline Cast: NONE SEEN /LPF
Ketones, ur: NEGATIVE
Nitrites, Initial: NEGATIVE
Protein, ur: NEGATIVE
Specific Gravity, Urine: 1.01 (ref 1.001–1.035)
pH: 5.5 (ref 5.0–8.0)

## 2022-02-11 LAB — URINE CULTURE
MICRO NUMBER:: 14048299
SPECIMEN QUALITY:: ADEQUATE

## 2022-02-11 LAB — CULTURE INDICATED

## 2022-02-11 NOTE — Telephone Encounter (Signed)
Physical in December 2022.  Stable at that time, plan for 1 year follow-up.  Trazodone refilled as upcoming appointment December 27.

## 2022-02-25 ENCOUNTER — Ambulatory Visit: Payer: 59

## 2022-03-18 ENCOUNTER — Ambulatory Visit
Admission: RE | Admit: 2022-03-18 | Discharge: 2022-03-18 | Disposition: A | Discharge status: Home / Self Care | Payer: 59 | Source: Ambulatory Visit | Attending: Critical Care Medicine | Admitting: Critical Care Medicine

## 2022-03-18 DIAGNOSIS — Z1231 Encounter for screening mammogram for malignant neoplasm of breast: Secondary | ICD-10-CM

## 2022-04-14 ENCOUNTER — Encounter: Payer: Self-pay | Admitting: Family Medicine

## 2022-04-14 ENCOUNTER — Ambulatory Visit (INDEPENDENT_AMBULATORY_CARE_PROVIDER_SITE_OTHER): Payer: 59 | Admitting: Family Medicine

## 2022-04-14 VITALS — BP 136/82 | HR 74 | Temp 98.3°F | Ht 67.0 in | Wt 202.8 lb

## 2022-04-14 DIAGNOSIS — E785 Hyperlipidemia, unspecified: Secondary | ICD-10-CM

## 2022-04-14 DIAGNOSIS — G4700 Insomnia, unspecified: Secondary | ICD-10-CM | POA: Diagnosis not present

## 2022-04-14 DIAGNOSIS — Z131 Encounter for screening for diabetes mellitus: Secondary | ICD-10-CM | POA: Diagnosis not present

## 2022-04-14 DIAGNOSIS — Z0000 Encounter for general adult medical examination without abnormal findings: Secondary | ICD-10-CM

## 2022-04-14 DIAGNOSIS — F32A Depression, unspecified: Secondary | ICD-10-CM | POA: Diagnosis not present

## 2022-04-14 DIAGNOSIS — Z1159 Encounter for screening for other viral diseases: Secondary | ICD-10-CM

## 2022-04-14 LAB — LIPID PANEL
Cholesterol: 249 mg/dL — ABNORMAL HIGH (ref 0–200)
HDL: 60.8 mg/dL (ref >39.00)
LDL Cholesterol: 156 mg/dL — ABNORMAL HIGH (ref 0–99)
NonHDL: 188.36
Total CHOL/HDL Ratio: 4
Triglycerides: 161 mg/dL — ABNORMAL HIGH (ref 0.0–149.0)
VLDL: 32.2 mg/dL (ref 0.0–40.0)

## 2022-04-14 LAB — COMPREHENSIVE METABOLIC PANEL
ALT: 33 U/L (ref 0–35)
AST: 24 U/L (ref 0–37)
Albumin: 4.5 g/dL (ref 3.5–5.2)
Alkaline Phosphatase: 72 U/L (ref 39–117)
BUN: 14 mg/dL (ref 6–23)
CO2: 30 mEq/L (ref 19–32)
Calcium: 9.6 mg/dL (ref 8.4–10.5)
Chloride: 102 mEq/L (ref 96–112)
Creatinine, Ser: 0.83 mg/dL (ref 0.40–1.20)
GFR: 80.73 mL/min (ref >60.00)
Glucose, Bld: 98 mg/dL (ref 70–99)
Potassium: 4.8 mEq/L (ref 3.5–5.1)
Sodium: 139 mEq/L (ref 135–145)
Total Bilirubin: 0.3 mg/dL (ref 0.2–1.2)
Total Protein: 7.6 g/dL (ref 6.0–8.3)

## 2022-04-14 LAB — HEMOGLOBIN A1C: Hgb A1c MFr Bld: 6.3 % (ref 4.6–6.5)

## 2022-04-14 MED ORDER — FLUOXETINE HCL 40 MG PO CAPS: ORAL_CAPSULE | ORAL | 3 refills | Status: DC

## 2022-04-14 MED ORDER — TRAZODONE HCL 50 MG PO TABS: ORAL_TABLET | ORAL | 3 refills | Status: DC

## 2022-04-14 NOTE — Progress Notes (Signed)
Subjective:  Patient ID: Laurie Bond, female    DOB: Dec 20, 1970  Age: 51 y.o. MRN: [id number]  CC:  Chief Complaint  Patient presents with   Annual Exam    Pt states all is well    HPI Laurie Bond presents for Annual Exam PCP: me GYN: Dr. Dellis Filbert, [place] 01/29/22 - BV. Well female exam 11/24/21. S/p tubal ligation, ascus pap with neg HPV 2021, with neg pap 11/20/20. Repeat 2-76yr.on progestin - micronor for dysmenorrhea.    No health changes since last visit.  Now nurse at [employer]   Hyperlipidemia: The 10-year ASCVD risk score (Arnett DK, et al., 2019) is: 2.1%   Values used to calculate the score:     Age: [age]years     Sex: Female     Is Non-Hispanic African American: No     Diabetic: No     Tobacco smoker: No     Systolic Blood Pressure: 1433mmHg     Is BP treated: No     HDL Cholesterol: 48.3 mg/dL     Total Cholesterol: 210 mg/dL Low ASCVD risk score, diet/exercise approach. Some weight gain since last visit.  Sister passed in April. Some emotional eating, taking care of parents. Summer was better, then stressful back at school, covid end of September. Ready to get back into better eating.    Wt Readings from Last 3 Encounters:  04/14/22 202 lb 12.8 oz (92 kg)  11/24/21 192 lb (87.1 kg)  04/09/21 178 lb 3.2 oz (80.8 kg)    Lab Results  Component Value Date   CHOL 210 (H) 04/09/2021   HDL 48.30 04/09/2021   LDLCALC 143 (H) 04/03/2020   LDLDIRECT 153.0 04/09/2021   TRIG 245.0 (H) 04/09/2021   CHOLHDL 4 04/09/2021   Lab Results  Component Value Date   ALT 15 04/09/2021   AST 14 04/09/2021   ALKPHOS 58 04/09/2021   BILITOT 0.5 04/09/2021   GERD Omeprazole as needed., GI Dr. NSilverio Decampprior endoscopy in 2020 with gastritis but no PUD.  No recent PPI need.   Depression: fluoxetine 40 mg daily, trazodone 560mnightly.  Discussed last year, had been meeting with therapist with her daughter, daughter is doing better. No personal  therapist, but is considering returning to therapist. Doing ok on curren meds.      04/14/2022    8:03 AM 04/09/2021    8:06 AM 10/02/2020    8:27 AM 04/24/2020   11:03 AM 04/03/2020   11:08 AM  Depression screen PHQ 2/9  Decreased Interest 0 0 0 0 0  Down, Depressed, Hopeless 0 0 0 0 0  PHQ - 2 Score 0 0 0 0 0  Altered sleeping 0 0 0    Tired, decreased energy 0 0 0    Change in appetite 0 0 0    Feeling bad or failure about yourself  1 0 0    Trouble concentrating 0 0 0    Moving slowly or fidgety/restless 0 0 0    Suicidal thoughts 0 0 0    PHQ-9 Score 1 0 0    Difficult doing work/chores  Not difficult at all        Health Maintenance  Topic Date Due   COVID-19 Vaccine (5 - 2023-24 season) 04/30/2022 (Originally 12/18/2021)   Hepatitis C Screening  04/15/2023 (Originally 04/21/1987)   MAMMOGRAM  03/19/2023   COLONOSCOPY (Pts 45-4923yrnsurance coverage will need to be confirmed)  05/04/2023   PAP SMEAR-Modifier  11/21/2023   DTaP/Tdap/Td (3 - Td or Tdap) 01/26/2026   INFLUENZA VACCINE  Completed   HIV Screening  Completed   Zoster Vaccines- Shingrix  Completed   HPV VACCINES  Aged Out  Colonoscopy January 2020, single 5 mm polyp.  Sessile serrated polyp.  Mild diverticulosis.  Nonbleeding internal hemorrhoids Mammogram March 18, 2022.  No evidence of malignancy Pap 2022 as above, repeat 2 to 3 years Hep C screen: agrees to today.   Immunization History  Administered Date(s) Administered   Hepatitis A 01/25/2006, 08/23/2006   Hepatitis B 03/02/2006, 04/21/2006, 08/23/2006   Influenza Inj Mdck Quad Pf 02/12/2022   Influenza Split 01/31/2008, 12/27/2008, 01/21/2010, 01/18/2012, 01/17/2013   Influenza,inj,Quad PF,6+ Mos 02/04/2017   Influenza-Unspecified 01/15/2014, 02/16/2020, 03/03/2021, 03/31/2021   MMR [birth year]1970, 01/24/1990   Moderna Sars-Covid-2 Vaccination 04/18/2019, 05/16/2019   PFIZER(Purple Top)SARS-COV-2 Vaccination 04/17/2020   PPD Test 02/18/2007,  02/10/2008, 06/18/2008, 07/08/2009, 09/17/2010   Pfizer Covid-19 Vaccine Bivalent Booster 54yr & up 02/06/2021   Tdap 01/25/2006, 01/27/2016   Zoster Recombinat (Shingrix) 11/27/2021, 02/12/2022  COVID booster: infection in September. Plans in January.   No results found. Optho - s/p cataract surgery, Dr. BValetta Close- yearly visits  Dental:Within Last 6 months  Alcohol: less than once per week.   Tobacco: none  Exercise: walking, but plans increased. Not interested in meds for weight loss. Aware of how to lose weight. Working on maintenance.  Body mass index is 31.76 kg/m.    History Patient Active Problem List   Diagnosis Date Noted   Hyperlipidemia 03/07/2012   Depression 03/07/2012   Immune to varicella 03/07/2006   Past Medical History:  Diagnosis Date   Cholecystitis with cholelithiasis 03/15/2015   Depression    Dyspepsia and disorder of function of stomach 01/16/2014   Mild reflux symptoms, effectively reduced with Prilosec.    Gestational diabetes 2010   2nd of 2 pregnancies   Hyperlipidemia    Obesity    Pre-eclampsia 2004   Pre-eclampsia 2004   1st of 2 pregnancies   Past Surgical History:  Procedure Laterality Date   CATARACT EXTRACTION     right 6/22, left 2/23   LAPAROSCOPIC CHOLECYSTECTOMY     TUBAL LIGATION     WISDOM TOOTH EXTRACTION     No Known Allergies Prior to Admission medications   Medication Sig Start Date End Date Taking? Authorizing Provider  FLUoxetine (PROZAC) 40 MG capsule TAKE 1 CAPSULE(40 MG) BY MOUTH DAILY 04/09/21  Yes GWendie Agreste MD  norethindrone (MICRONOR) 0.35 MG tablet Take 1 tablet (0.35 mg total) by mouth daily. 11/24/21  Yes LPrincess Bruins MD  traZODone (DESYREL) 50 MG tablet TAKE 1 TABLET(50 MG) BY MOUTH AT BEDTIME 02/11/22  Yes GWendie Agreste MD   Social History   Socioeconomic History   Marital status: Married    Spouse name: DShanon Brow  Number of children: 2   Years of education: 14   Highest education  level: Not on file  Occupational History   Occupation: RProgrammer, [employer]  Tobacco Use   Smoking status: Never   Smokeless tobacco: Never  Vaping Use   Vaping Use: Never used  Substance and Sexual Activity   Alcohol use: Not Currently    Comment: once a week    Drug use: No   Sexual activity: Yes    Partners: Male    Birth control/protection: OCP    Comment: 1st intercourse- 16,  partners- 5  Other Topics Concern   Not on file  Social History Narrative   Lives with husband and 2 children.   Social Determinants of Health   Financial Resource Strain: Not on file  Food Insecurity: Not on file  Transportation Needs: Not on file  Physical Activity: Not on file  Stress: Not on file  Social Connections: Not on file  Intimate Partner Violence: Not on file    Review of Systems 13 point review of systems per patient health survey noted.  Negative other than as indicated above or in HPI.    Objective:   Vitals:   04/14/22 0803  BP: 136/82  Pulse: 74  Temp: 98.3 F (36.8 C)  SpO2: 98%  Weight: 202 lb 12.8 oz (92 kg)  Height: _0  (1.702 m)     Physical Exam Vitals reviewed.  Constitutional:      Appearance: She is well-developed.  HENT:     Head: Normocephalic and atraumatic.     Right Ear: External ear normal.     Left Ear: External ear normal.  Eyes:     Conjunctiva/sclera: Conjunctivae normal.     Pupils: Pupils are equal, round, and reactive to light.  Neck:     Thyroid: No thyromegaly.  Cardiovascular:     Rate and Rhythm: Normal rate and regular rhythm.     Heart sounds: Normal heart sounds. No murmur heard. Pulmonary:     Effort: Pulmonary effort is normal. No respiratory distress.     Breath sounds: Normal breath sounds. No wheezing.  Abdominal:     General: Bowel sounds are normal.     Palpations: Abdomen is soft.     Tenderness: There is no abdominal tenderness.  Musculoskeletal:        General: No tenderness. Normal  range of motion.     Cervical back: Normal range of motion and neck supple.  Lymphadenopathy:     Cervical: No cervical adenopathy.  Skin:    General: Skin is warm and dry.     Findings: No rash.  Neurological:     Mental Status: She is alert and oriented to person, place, and time.  Psychiatric:        Behavior: Behavior normal.        Thought Content: Thought content normal.        Assessment & Plan:  Laurie Bond is a 51 y.o. female . Annual physical exam  - -anticipatory guidance as below in AVS, screening labs above. Health maintenance items as above in HPI discussed/recommended as applicable.   Depression, unspecified depression type - Plan: FLUoxetine (PROZAC) 40 MG capsule Insomnia, unspecified type - Plan: traZODone (DESYREL) 50 MG tablet  - overall stable. Some weight gain with stressors this year. Plans to meet with therapist, diet and exercise changes.   -Continue same dose Prozac, trazodone.  Hyperlipidemia, unspecified hyperlipidemia type - Plan: Comprehensive metabolic panel, Lipid panel  -Low ASCVD risk score.  As above plan some increased diet changes, exercise changes for weight loss.  Check labs, no new meds for now.  Need for hepatitis C screening test - Plan: Hepatitis C antibody  Screening for diabetes mellitus - Plan: Hemoglobin A1c   Meds ordered this encounter  Medications   traZODone (DESYREL) 50 MG tablet    Sig: TAKE 1 TABLET(50 MG) BY MOUTH AT BEDTIME    Dispense:  90 tablet    Refill:  3   FLUoxetine (PROZAC) 40 MG capsule    Sig:  TAKE 1 CAPSULE(40 MG) BY MOUTH DAILY    Dispense:  90 capsule    Refill:  3   Patient Instructions  Thanks for coming in today. If any concerns on labs I will let you know. Take care!  Here is an option for counseling:  [facility]:  [phone]  Preventive Care 91-64 Years Old, Female Preventive care refers to lifestyle choices and visits with your health care provider that  can promote health and wellness. Preventive care visits are also called wellness exams. What can I expect for my preventive care visit? Counseling Your health care provider may ask you questions about your: Medical history, including: Past medical problems. Family medical history. Pregnancy history. Current health, including: Menstrual cycle. Method of birth control. Emotional well-being. Home life and relationship well-being. Sexual activity and sexual health. Lifestyle, including: Alcohol, nicotine or tobacco, and drug use. Access to firearms. Diet, exercise, and sleep habits. Work and work Statistician. Sunscreen use. Safety issues such as seatbelt and bike helmet use. Physical exam Your health care provider will check your: Height and weight. These may be used to calculate your BMI (body mass index). BMI is a measurement that tells if you are at a healthy weight. Waist circumference. This measures the distance around your waistline. This measurement also tells if you are at a healthy weight and may help predict your risk of certain diseases, such as type 2 diabetes and high blood pressure. Heart rate and blood pressure. Body temperature. Skin for abnormal spots. What immunizations do I need?  Vaccines are usually given at various ages, according to a schedule. Your health care provider will recommend vaccines for you based on your age, medical history, and lifestyle or other factors, such as travel or where you work. What tests do I need? Screening Your health care provider may recommend screening tests for certain conditions. This may include: Lipid and cholesterol levels. Diabetes screening. This is done by checking your blood sugar (glucose) after you have not eaten for a while (fasting). Pelvic exam and Pap test. Hepatitis B test. Hepatitis C test. HIV (human immunodeficiency virus) test. STI (sexually transmitted infection) testing, if you are at risk. Lung cancer  screening. Colorectal cancer screening. Mammogram. Talk with your health care provider about when you should start having regular mammograms. This may depend on whether you have a family history of breast cancer. BRCA-related cancer screening. This may be done if you have a family history of breast, ovarian, tubal, or peritoneal cancers. Bone density scan. This is done to screen for osteoporosis. Talk with your health care provider about your test results, treatment options, and if necessary, the need for more tests. Follow these instructions at home: Eating and drinking  Eat a diet that includes fresh fruits and vegetables, whole grains, lean protein, and low-fat dairy products. Take vitamin and mineral supplements as recommended by your health care provider. Do not drink alcohol if: Your health care provider tells you not to drink. You are pregnant, may be pregnant, or are planning to become pregnant. If you drink alcohol: Limit how much you have to 0-1 drink a day. Know how much alcohol is in your drink. In the [place] one drink equals one 12 oz bottle of beer (355 mL), one 5 oz glass of wine (148 mL), or one 1 oz glass of hard liquor (44 mL). Lifestyle Brush your teeth every morning and night with fluoride toothpaste. Floss one time each day. Exercise for at least 30 minutes 5 or  more days each week. Do not use any products that contain nicotine or tobacco. These products include cigarettes, chewing tobacco, and vaping devices, such as e-cigarettes. If you need help quitting, ask your health care provider. Do not use drugs. If you are sexually active, practice safe sex. Use a condom or other form of protection to prevent STIs. If you do not wish to become pregnant, use a form of birth control. If you plan to become pregnant, see your health care provider for a prepregnancy visit. Take aspirin only as told by your health care provider. Make sure that you understand how much to take and what  form to take. Work with your health care provider to find out whether it is safe and beneficial for you to take aspirin daily. Find healthy ways to manage stress, such as: Meditation, yoga, or listening to music. Journaling. Talking to a trusted person. Spending time with friends and family. Minimize exposure to UV radiation to reduce your risk of skin cancer. Safety Always wear your seat belt while driving or riding in a vehicle. Do not drive: If you have been drinking alcohol. Do not ride with someone who has been drinking. When you are tired or distracted. While texting. If you have been using any mind-altering substances or drugs. Wear a helmet and other protective equipment during sports activities. If you have firearms in your house, make sure you follow all gun safety procedures. Seek help if you have been physically or sexually abused. What's next? Visit your health care provider once a year for an annual wellness visit. Ask your health care provider how often you should have your eyes and teeth checked. Stay up to date on all vaccines. This information is not intended to replace advice given to you by your health care provider. Make sure you discuss any questions you have with your health care provider. Document Revised: 10/01/2020 Document Reviewed: 10/01/2020 Elsevier Patient Education  [place],   Merri Ray, MD [place] 04/14/22 8:39 AM

## 2022-04-14 NOTE — Patient Instructions (Addendum)
Thanks for coming in today. If any concerns on labs I will let you know. Take care!  Here is an option for counseling:  [facility]:  [phone]  Preventive Care 37-52 Years Old, Female Preventive care refers to lifestyle choices and visits with your health care provider that can promote health and wellness. Preventive care visits are also called wellness exams. What can I expect for my preventive care visit? Counseling Your health care provider may ask you questions about your: Medical history, including: Past medical problems. Family medical history. Pregnancy history. Current health, including: Menstrual cycle. Method of birth control. Emotional well-being. Home life and relationship well-being. Sexual activity and sexual health. Lifestyle, including: Alcohol, nicotine or tobacco, and drug use. Access to firearms. Diet, exercise, and sleep habits. Work and work Statistician. Sunscreen use. Safety issues such as seatbelt and bike helmet use. Physical exam Your health care provider will check your: Height and weight. These may be used to calculate your BMI (body mass index). BMI is a measurement that tells if you are at a healthy weight. Waist circumference. This measures the distance around your waistline. This measurement also tells if you are at a healthy weight and may help predict your risk of certain diseases, such as type 2 diabetes and high blood pressure. Heart rate and blood pressure. Body temperature. Skin for abnormal spots. What immunizations do I need?  Vaccines are usually given at various ages, according to a schedule. Your health care provider will recommend vaccines for you based on your age, medical history, and lifestyle or other factors, such as travel or where you work. What tests do I need? Screening Your health care provider may recommend screening tests for certain conditions. This may include: Lipid and cholesterol  levels. Diabetes screening. This is done by checking your blood sugar (glucose) after you have not eaten for a while (fasting). Pelvic exam and Pap test. Hepatitis B test. Hepatitis C test. HIV (human immunodeficiency virus) test. STI (sexually transmitted infection) testing, if you are at risk. Lung cancer screening. Colorectal cancer screening. Mammogram. Talk with your health care provider about when you should start having regular mammograms. This may depend on whether you have a family history of breast cancer. BRCA-related cancer screening. This may be done if you have a family history of breast, ovarian, tubal, or peritoneal cancers. Bone density scan. This is done to screen for osteoporosis. Talk with your health care provider about your test results, treatment options, and if necessary, the need for more tests. Follow these instructions at home: Eating and drinking  Eat a diet that includes fresh fruits and vegetables, whole grains, lean protein, and low-fat dairy products. Take vitamin and mineral supplements as recommended by your health care provider. Do not drink alcohol if: Your health care provider tells you not to drink. You are pregnant, may be pregnant, or are planning to become pregnant. If you drink alcohol: Limit how much you have to 0-1 drink a day. Know how much alcohol is in your drink. In the [place] one drink equals one 12 oz bottle of beer (355 mL), one 5 oz glass of wine (148 mL), or one 1 oz glass of hard liquor (44 mL). Lifestyle Brush your teeth every morning and night with fluoride toothpaste. Floss one time each day. Exercise for at least 30 minutes 5 or more days each week. Do not use any products that contain nicotine or tobacco. These products include cigarettes, chewing tobacco, and vaping devices, such as e-cigarettes.  you need help quitting, ask your health care provider. Do not use drugs. If you are sexually active, practice safe sex. Use a condom or other form of  protection to prevent STIs. If you do not wish to become pregnant, use a form of birth control. If you plan to become pregnant, see your health care provider for a prepregnancy visit. Take aspirin only as told by your health care provider. Make sure that you understand how much to take and what form to take. Work with your health care provider to find out whether it is safe and beneficial for you to take aspirin daily. Find healthy ways to manage stress, such as: Meditation, yoga, or listening to music. Journaling. Talking to a trusted person. Spending time with friends and family. Minimize exposure to UV radiation to reduce your risk of skin cancer. Safety Always wear your seat belt while driving or riding in a vehicle. Do not drive: If you have been drinking alcohol. Do not ride with someone who has been drinking. When you are tired or distracted. While texting. If you have been using any mind-altering substances or drugs. Wear a helmet and other protective equipment during sports activities. If you have firearms in your house, make sure you follow all gun safety procedures. Seek help if you have been physically or sexually abused. What's next? Visit your health care provider once a year for an annual wellness visit. Ask your health care provider how often you should have your eyes and teeth checked. Stay up to date on all vaccines. This information is not intended to replace advice given to you by your health care provider. Make sure you discuss any questions you have with your health care provider. Document Revised: 10/01/2020 Document Reviewed: 10/01/2020 Elsevier Patient Education  2023 [employer]  

## 2022-04-15 LAB — HEPATITIS C ANTIBODY: Hepatitis C Ab: NONREACTIVE

## 2022-05-07 ENCOUNTER — Ambulatory Visit: Payer: 59 | Admitting: Radiology

## 2022-05-07 ENCOUNTER — Encounter: Payer: Self-pay | Admitting: Obstetrics & Gynecology

## 2022-05-07 VITALS — BP 116/78

## 2022-05-07 DIAGNOSIS — N816 Rectocele: Secondary | ICD-10-CM | POA: Diagnosis not present

## 2022-05-07 DIAGNOSIS — N898 Other specified noninflammatory disorders of vagina: Secondary | ICD-10-CM | POA: Diagnosis not present

## 2022-05-07 LAB — WET PREP FOR TRICH, YEAST, CLUE

## 2022-05-07 NOTE — Progress Notes (Signed)
      Subjective: Laurie Bond is a 52 y.o. female Treated for BV 01/2022. Now has same symptoms, light vaginal discharge, unsure if has odor, no itching, pelvic pressure. Symptoms x's 3 days. Also with vaginal protrusion x's few months.      Review of Systems  All other systems reviewed and are negative.   Past Medical History:  Diagnosis Date   Cholecystitis with cholelithiasis 03/15/2015   Depression    Dyspepsia and disorder of function of stomach 01/16/2014   Mild reflux symptoms, effectively reduced with Prilosec.    Gestational diabetes 2010   2nd of 2 pregnancies   Hyperlipidemia    Obesity    Pre-eclampsia 2004   Pre-eclampsia 2004   1st of 2 pregnancies      Objective:  Today's Vitals   05/07/22 1015  BP: 116/78   There is no height or weight on file to calculate BMI.   -General: no acute distress -Vulva: without lesions or discharge -Vagina: scant discharge present, wet prep obtained stage 1 rectocele -Cervix: no lesion or discharge, no CMT -Perineum: no lesions -Uterus: Mobile, non tender -Adnexa: no masses or tenderness   Microscopic wet-mount exam shows negative for pathogens, normal epithelial cells.   Chaperone offered and declined.  Assessment:/Plan:   1. Vaginal discharge  - WET PREP FOR [place], YEAST, CLUE  2. Rectocele Stage 1, discussed strengthening pelvic floor, keep stools soft, avoid straining    Avoid intercourse until symptoms are resolved. Safe sex encouraged. Avoid the use of soaps or perfumed products in the peri area. Avoid tub baths and sitting in sweaty or wet clothing for prolonged periods of time.

## 2022-05-07 NOTE — Telephone Encounter (Signed)
Per JC, please see if pt can come in this AM.

## 2022-07-23 ENCOUNTER — Telehealth: Payer: Self-pay | Admitting: *Deleted

## 2022-07-23 NOTE — Telephone Encounter (Signed)
Patient seen in office 01/29/22, PUS ordered for pelvic pain.   PUS not scheduled to date.   Patient was seen by Clearnce Hasten, NP on 05/07/22.   Dr. Seymour Bars -ok to cancel PUS order?

## 2022-07-26 NOTE — Telephone Encounter (Signed)
Genia Del, MD  You3 days ago    Yes, if pelvic pain resolved, cancel Pelvic US. Dr Elbert Ewings    Order cancelled.   Encounter closed.

## 2022-11-30 ENCOUNTER — Ambulatory Visit: Payer: 59 | Admitting: Obstetrics & Gynecology

## 2022-12-10 ENCOUNTER — Other Ambulatory Visit: Payer: Self-pay | Admitting: Obstetrics & Gynecology

## 2022-12-13 ENCOUNTER — Other Ambulatory Visit: Payer: Self-pay | Admitting: Obstetrics and Gynecology

## 2022-12-13 NOTE — Telephone Encounter (Signed)
Med refill request:norethindrone 0.35 mg tab Last AEX: 11/24/21 -ML Next AEX: Not scheduled.  Last MMG (if hormonal med) 03/18/22 -BiRads 1 neg   Call placed to patient, left detailed message, advised RF request received, will send to covering provider for 30 day supply, will need updated AEX for future refills. Return call to [phone], opt 1 to schedule.   Rx pended #30/0RF

## 2023-01-09 ENCOUNTER — Other Ambulatory Visit: Payer: Self-pay | Admitting: Obstetrics and Gynecology

## 2023-01-09 DIAGNOSIS — N946 Dysmenorrhea, unspecified: Secondary | ICD-10-CM

## 2023-01-11 ENCOUNTER — Other Ambulatory Visit: Payer: Self-pay | Admitting: Obstetrics and Gynecology

## 2023-01-11 DIAGNOSIS — N946 Dysmenorrhea, unspecified: Secondary | ICD-10-CM

## 2023-01-11 NOTE — Telephone Encounter (Signed)
Med refill request: Laurie Bond 0.35mg  Last AEX: 11/24/21 Next AEX: 02/16/23 Last MMG (if hormonal med) 03/18/22 Refill sent to provider for approval.

## 2023-01-11 NOTE — Telephone Encounter (Signed)
Med refill request:  Laurie Bond 0.35mg  Last AEX: 11/24/21 Next AEX: 02/16/23 Last MMG (if hormonal med) 03/18/22 Refill request for #84 due to insurance/cost. Sent to provider for approval.

## 2023-02-16 ENCOUNTER — Encounter: Payer: Self-pay | Admitting: Radiology

## 2023-02-16 ENCOUNTER — Ambulatory Visit (INDEPENDENT_AMBULATORY_CARE_PROVIDER_SITE_OTHER): Payer: 59 | Admitting: Radiology

## 2023-02-16 VITALS — BP 136/76 | Ht 66.75 in | Wt 170.0 lb

## 2023-02-16 DIAGNOSIS — Z01419 Encounter for gynecological examination (general) (routine) without abnormal findings: Secondary | ICD-10-CM

## 2023-02-16 DIAGNOSIS — N951 Menopausal and female climacteric states: Secondary | ICD-10-CM

## 2023-02-16 MED ORDER — ESTRADIOL 0.025 MG/24HR TD PTTW
1.0000 | MEDICATED_PATCH | TRANSDERMAL | 0 refills | Status: DC
Start: 2023-02-17 — End: 2023-05-31

## 2023-02-16 MED ORDER — PROGESTERONE MICRONIZED 100 MG PO CAPS: 100.0000 mg | ORAL_CAPSULE | Freq: Every day | ORAL | 0 refills | Status: DC

## 2023-02-16 NOTE — Patient Instructions (Signed)
Preventive Care 53-53 Years Old, Female Preventive care refers to lifestyle choices and visits with your health care provider that can promote health and wellness. Preventive care visits are also called wellness exams. What can I expect for my preventive care visit? Counseling Your health care provider may ask you questions about your: Medical history, including: Past medical problems. Family medical history. Pregnancy history. Current health, including: Menstrual cycle. Method of birth control. Emotional well-being. Home life and relationship well-being. Sexual activity and sexual health. Lifestyle, including: Alcohol, nicotine or tobacco, and drug use. Access to firearms. Diet, exercise, and sleep habits. Work and work environment. Sunscreen use. Safety issues such as seatbelt and bike helmet use. Physical exam Your health care provider will check your: Height and weight. These may be used to calculate your BMI (body mass index). BMI is a measurement that tells if you are at a healthy weight. Waist circumference. This measures the distance around your waistline. This measurement also tells if you are at a healthy weight and may help predict your risk of certain diseases, such as type 2 diabetes and high blood pressure. Heart rate and blood pressure. Body temperature. Skin for abnormal spots. What immunizations do I need?  Vaccines are usually given at various ages, according to a schedule. Your health care provider will recommend vaccines for you based on your age, medical history, and lifestyle or other factors, such as travel or where you work. What tests do I need? Screening Your health care provider may recommend screening tests for certain conditions. This may include: Lipid and cholesterol levels. Diabetes screening. This is done by checking your blood sugar (glucose) after you have not eaten for a while (fasting). Pelvic exam and Pap test. Hepatitis B test. Hepatitis C  test. HIV (human immunodeficiency virus) test. STI (sexually transmitted infection) testing, if you are at risk. Lung cancer screening. Colorectal cancer screening. Mammogram. Talk with your health care provider about when you should start having regular mammograms. This may depend on whether you have a family history of breast cancer. BRCA-related cancer screening. This may be done if you have a family history of breast, ovarian, tubal, or peritoneal cancers. Bone density scan. This is done to screen for osteoporosis. Talk with your health care provider about your test results, treatment options, and if necessary, the need for more tests. Follow these instructions at home: Eating and drinking  Eat a diet that includes fresh fruits and vegetables, whole grains, lean protein, and low-fat dairy products. Take vitamin and mineral supplements as recommended by your health care provider. Do not drink alcohol if: Your health care provider tells you not to drink. You are pregnant, may be pregnant, or are planning to become pregnant. If you drink alcohol: Limit how much you have to 0-1 drink a day. Know how much alcohol is in your drink. In the [place] one drink equals one 12 oz bottle of beer (355 mL), one 5 oz glass of wine (148 mL), or one 1 oz glass of hard liquor (44 mL). Lifestyle Brush your teeth every morning and night with fluoride toothpaste. Floss one time each day. Exercise for at least 30 minutes 5 or more days each week. Do not use any products that contain nicotine or tobacco. These products include cigarettes, chewing tobacco, and vaping devices, such as e-cigarettes. If you need help quitting, ask your health care provider. Do not use drugs. If you are sexually active, practice safe sex. Use a condom or other form of protection to   prevent STIs. If you do not wish to become pregnant, use a form of birth control. If you plan to become pregnant, see your health care provider for a  prepregnancy visit. Take aspirin only as told by your health care provider. Make sure that you understand how much to take and what form to take. Work with your health care provider to find out whether it is safe and beneficial for you to take aspirin daily. Find healthy ways to manage stress, such as: Meditation, yoga, or listening to music. Journaling. Talking to a trusted person. Spending time with friends and family. Minimize exposure to UV radiation to reduce your risk of skin cancer. Safety Always wear your seat belt while driving or riding in a vehicle. Do not drive: If you have been drinking alcohol. Do not ride with someone who has been drinking. When you are tired or distracted. While texting. If you have been using any mind-altering substances or drugs. Wear a helmet and other protective equipment during sports activities. If you have firearms in your house, make sure you follow all gun safety procedures. Seek help if you have been physically or sexually abused. What's next? Visit your health care provider once a year for an annual wellness visit. Ask your health care provider how often you should have your eyes and teeth checked. Stay up to date on all vaccines. This information is not intended to replace advice given to you by your health care provider. Make sure you discuss any questions you have with your health care provider. Document Revised: 10/01/2020 Document Reviewed: 10/01/2020 Elsevier Patient Education  2024 [employer]  

## 2023-02-16 NOTE — Progress Notes (Signed)
   Laurie Bond 1970/07/11 [id number]   History: Postmenopausal 52 y.o. presents for annual exam. Currently on POPs, amenorrheic. Has some joint aches and pains, occasional hot flashes have improved. Mother has osteoporosis. Interested in preventative benefits of HRT.   Gynecologic History Postmenopausal Last Pap: 2022. Results were: normal Last mammogram: 11/23. Results were: normal Last colonoscopy: 02/2019   Obstetric History OB History  Gravida Para Term Preterm AB Living  2 2 0 2   2  SAB IAB Ectopic Multiple Live Births               # Outcome Date GA Lbr Len/2nd Weight Sex Type Anes PTL Lv  2 Preterm           1 Preterm              The following portions of the patient's history were reviewed and updated as appropriate: allergies, current medications, past family history, past medical history, past social history, past surgical history, and problem list.  Review of Systems Pertinent items noted in HPI and remainder of comprehensive ROS otherwise negative.  Past medical history, past surgical history, family history and social history were all reviewed and documented in the EPIC chart.  Exam:  Vitals:   02/16/23 1556  BP: 136/76  Weight: 170 lb (77.1 kg)  Height: 5' 6.75" (1.695 m)   Body mass index is 26.83 kg/m.  General appearance:  Normal Thyroid:  Symmetrical, normal in size, without palpable masses or nodularity. Respiratory  Auscultation:  Clear without wheezing or rhonchi Cardiovascular  Auscultation:  Regular rate, without rubs, murmurs or gallops  Edema/varicosities:  Not grossly evident Abdominal  Soft,nontender, without masses, guarding or rebound.  Liver/spleen:  No organomegaly noted  Hernia:  None appreciated  Skin  Inspection:  Grossly normal Breasts: Examined lying and sitting.   Right: Without masses, retractions, nipple discharge or axillary adenopathy.   Left: Without masses, retractions, nipple discharge or axillary  adenopathy. Genitourinary   Inguinal/mons:  Normal without inguinal adenopathy  External genitalia:  Normal appearing vulva with no masses, tenderness, or lesions  BUS/Urethra/Skene's glands:  Normal  Vagina:  Normal appearing with normal color and discharge, no lesions. Atrophy: mild   Cervix:  Normal appearing without discharge or lesions  Uterus:  Normal in size, shape and contour.  Midline and mobile, nontender  Adnexa/parametria:     Rt: Normal in size, without masses or tenderness.   Lt: Normal in size, without masses or tenderness.  Anus and perineum: Normal    Raynelle Fanning, CMA present for exam  Assessment/Plan:   1. Well woman exam with routine gynecological exam Pap 2025  2. Menopausal symptoms Risks and benefits reviewed - estradiol (VIVELLE-DOT) 0.025 MG/24HR; Place 1 patch onto the skin 2 (two) times a week.  Dispense: 24 patch; Refill: 0 - progesterone (PROMETRIUM) 100 MG capsule; Take 1 capsule (100 mg total) by mouth daily.  Dispense: 90 capsule; Refill: 0   Discussed SBE, colonoscopy and DEXA screening as directed. Recommend of exercise weekly, including weight bearing exercise. Encouraged the use of seatbelts and sunscreen.  Return in 3 months for  med follow up and 1 year for annual   Tanda Rockers WHNP-BC, 4:14 PM 02/16/2023

## 2023-02-17 ENCOUNTER — Ambulatory Visit: Payer: 59 | Admitting: Radiology

## 2023-02-22 ENCOUNTER — Other Ambulatory Visit: Payer: Self-pay | Admitting: Family Medicine

## 2023-02-22 DIAGNOSIS — Z1231 Encounter for screening mammogram for malignant neoplasm of breast: Secondary | ICD-10-CM

## 2023-02-25 IMAGING — MG MM DIGITAL SCREENING BILAT W/ TOMO AND CAD
8 series · 8 of 24 positions shown · non-contrast
Comparison: Previous exam(s).

CLINICAL DATA: Screening.

EXAM:
DIGITAL SCREENING BILATERAL MAMMOGRAM WITH TOMOSYNTHESIS AND CAD
TECHNIQUE: Bilateral screening digital craniocaudal and mediolateral oblique
mammograms were obtained. Bilateral screening digital breast
tomosynthesis was performed. The images were evaluated with
computer-aided detection.

[R CC synth-2D]
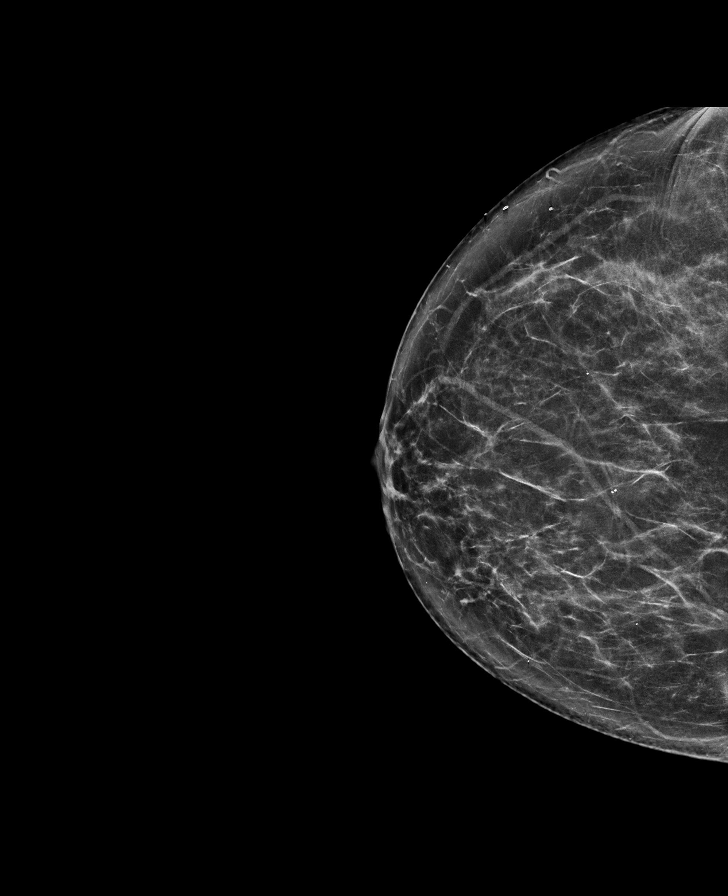

[L CC synth-2D]
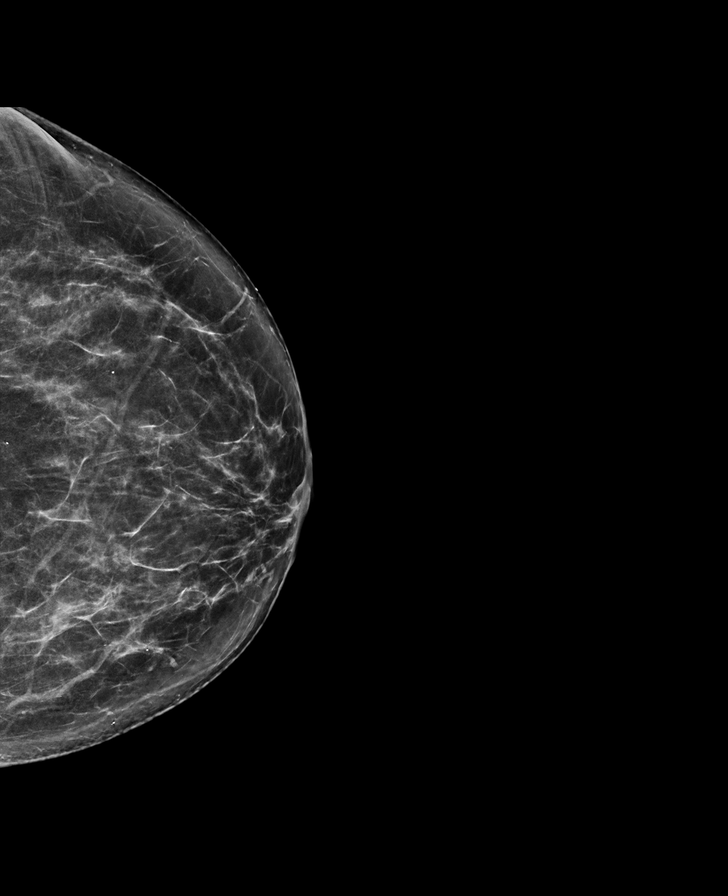

[R MLO synth-2D]
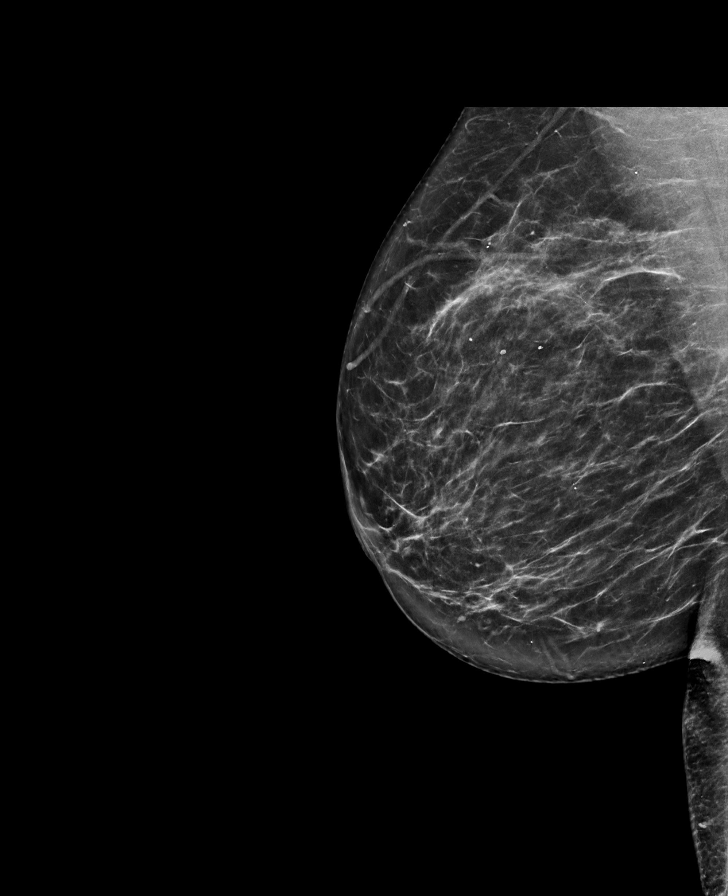

[L MLO synth-2D]
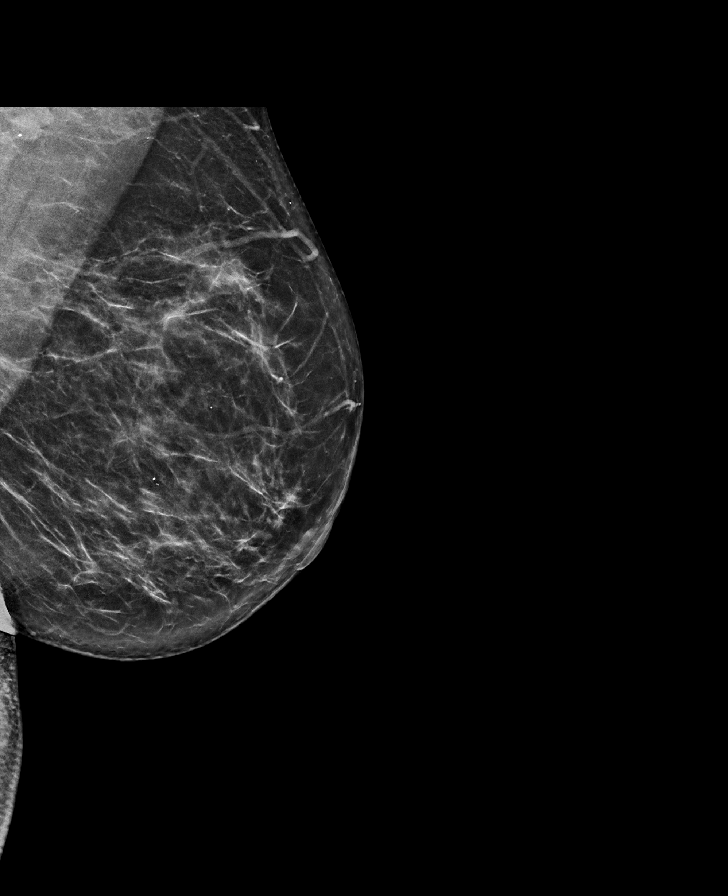

[R CC tomo · tomo slice 37/74.0]
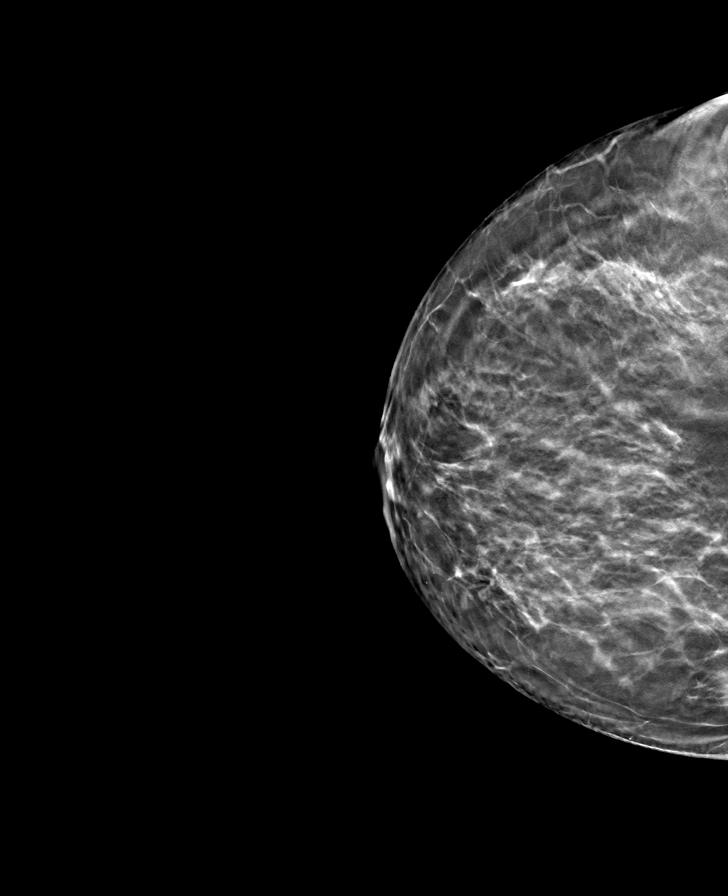

[L MLO tomo · tomo slice 39/77.0]
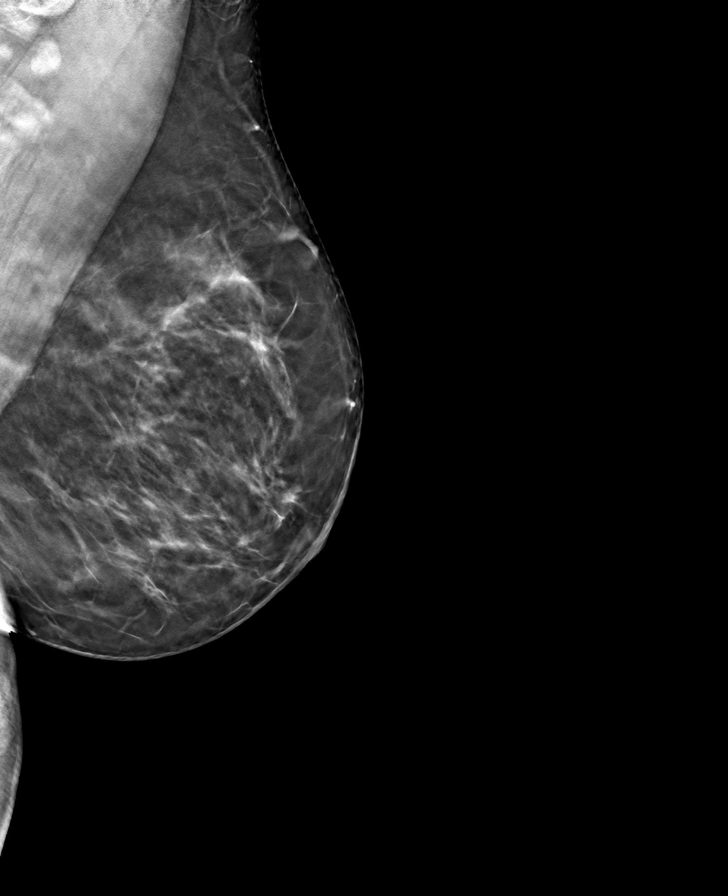

[R MLO tomo · tomo slice 39/77.0]
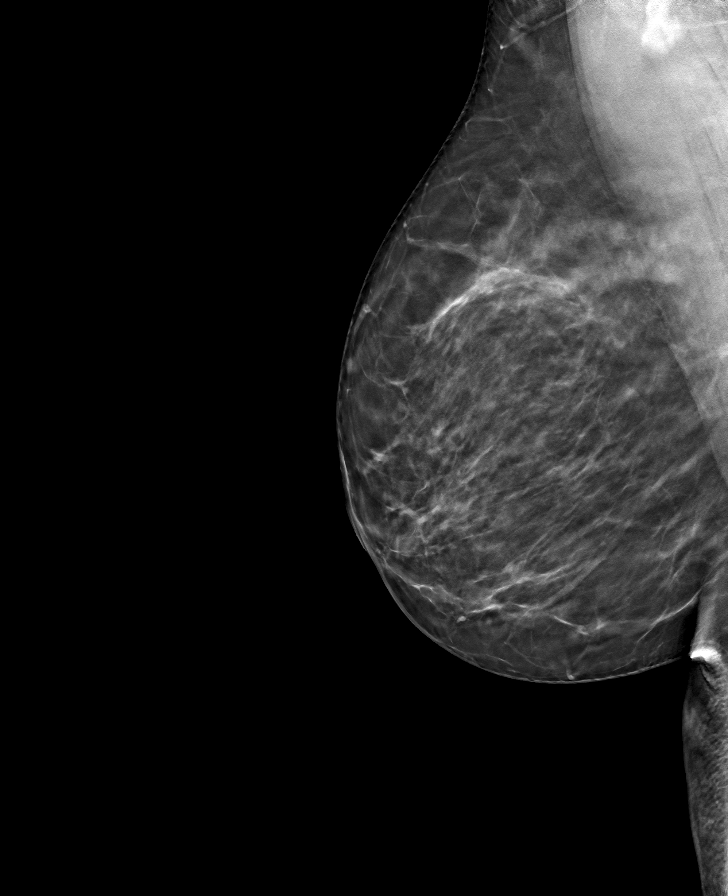

[L CC tomo · tomo slice 37/74.0]
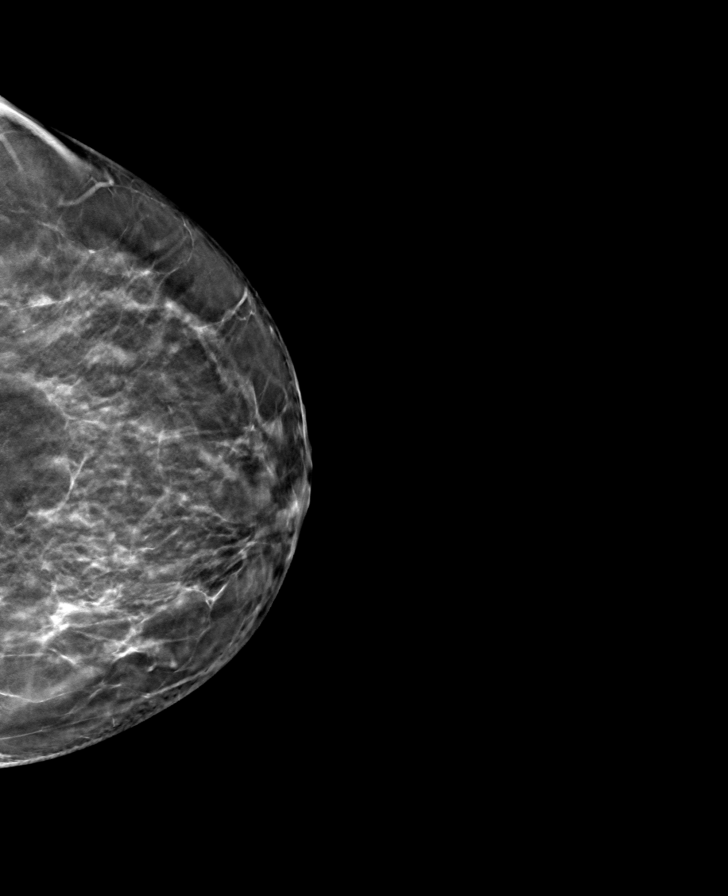

[8 of 24 positions shown; findings below may reference images not displayed]

ACR Breast Density Category b: There are scattered areas of
fibroglandular density.
FINDINGS: There are no findings suspicious for malignancy.
IMPRESSION: No mammographic evidence of malignancy. A result letter of this
screening mammogram will be mailed directly to the patient.

RECOMMENDATION:
Screening mammogram in one year. (Code:51-O-LD2)

BI-RADS CATEGORY  1: Negative.

## 2023-03-23 ENCOUNTER — Ambulatory Visit
Admission: RE | Admit: 2023-03-23 | Discharge: 2023-03-23 | Disposition: A | Discharge status: Home / Self Care | Payer: 59 | Source: Ambulatory Visit

## 2023-03-23 DIAGNOSIS — Z1231 Encounter for screening mammogram for malignant neoplasm of breast: Secondary | ICD-10-CM

## 2023-04-18 ENCOUNTER — Encounter: Payer: Self-pay | Admitting: Family Medicine

## 2023-04-18 ENCOUNTER — Ambulatory Visit (INDEPENDENT_AMBULATORY_CARE_PROVIDER_SITE_OTHER): Payer: 59 | Admitting: Family Medicine

## 2023-04-18 VITALS — BP 122/74 | HR 71 | Temp 97.1°F | Ht 67.0 in | Wt 173.6 lb

## 2023-04-18 DIAGNOSIS — Z0000 Encounter for general adult medical examination without abnormal findings: Secondary | ICD-10-CM | POA: Diagnosis not present

## 2023-04-18 DIAGNOSIS — R7303 Prediabetes: Secondary | ICD-10-CM

## 2023-04-18 DIAGNOSIS — F32A Depression, unspecified: Secondary | ICD-10-CM

## 2023-04-18 DIAGNOSIS — E785 Hyperlipidemia, unspecified: Secondary | ICD-10-CM | POA: Diagnosis not present

## 2023-04-18 DIAGNOSIS — G4700 Insomnia, unspecified: Secondary | ICD-10-CM

## 2023-04-18 LAB — COMPREHENSIVE METABOLIC PANEL
ALT: 22 U/L (ref 0–35)
AST: 20 U/L (ref 0–37)
Albumin: 4.4 g/dL (ref 3.5–5.2)
Alkaline Phosphatase: 64 U/L (ref 39–117)
BUN: 14 mg/dL (ref 6–23)
CO2: 27 meq/L (ref 19–32)
Calcium: 9.5 mg/dL (ref 8.4–10.5)
Chloride: 103 meq/L (ref 96–112)
Creatinine, Ser: 0.84 mg/dL (ref 0.40–1.20)
GFR: 79.02 mL/min (ref >60.00)
Glucose, Bld: 74 mg/dL (ref 70–99)
Potassium: 4.8 meq/L (ref 3.5–5.1)
Sodium: 138 meq/L (ref 135–145)
Total Bilirubin: 0.5 mg/dL (ref 0.2–1.2)
Total Protein: 7.3 g/dL (ref 6.0–8.3)

## 2023-04-18 LAB — HEMOGLOBIN A1C: Hgb A1c MFr Bld: 5.6 % (ref 4.6–6.5)

## 2023-04-18 LAB — LIPID PANEL
Cholesterol: 267 mg/dL — ABNORMAL HIGH (ref 0–200)
HDL: 66 mg/dL (ref >39.00)
LDL Cholesterol: 174 mg/dL — ABNORMAL HIGH (ref 0–99)
NonHDL: 201.49
Total CHOL/HDL Ratio: 4
Triglycerides: 137 mg/dL (ref 0.0–149.0)
VLDL: 27.4 mg/dL (ref 0.0–40.0)

## 2023-04-18 MED ORDER — TRAZODONE HCL 50 MG PO TABS: ORAL_TABLET | ORAL | 3 refills | Status: DC

## 2023-04-18 MED ORDER — FLUOXETINE HCL 40 MG PO CAPS: ORAL_CAPSULE | ORAL | 3 refills | Status: DC

## 2023-04-18 NOTE — Patient Instructions (Signed)
Great work with weight loss, continue diet and exercise approach.  I suspect lab work will look better today including the prediabetes test that was slightly elevated last year.  No med changes at this time but please let me know if there are questions.  Preventive Care 53-53 Years Old, Female Preventive care refers to lifestyle choices and visits with your health care provider that can promote health and wellness. Preventive care visits are also called wellness exams. What can I expect for my preventive care visit? Counseling Your health care provider may ask you questions about your: Medical history, including: Past medical problems. Family medical history. Pregnancy history. Current health, including: Menstrual cycle. Method of birth control. Emotional well-being. Home life and relationship well-being. Sexual activity and sexual health. Lifestyle, including: Alcohol, nicotine or tobacco, and drug use. Access to firearms. Diet, exercise, and sleep habits. Work and work Astronomer. Sunscreen use. Safety issues such as seatbelt and bike helmet use. Physical exam Your health care provider will check your: Height and weight. These may be used to calculate your BMI (body mass index). BMI is a measurement that tells if you are at a healthy weight. Waist circumference. This measures the distance around your waistline. This measurement also tells if you are at a healthy weight and may help predict your risk of certain diseases, such as type 2 diabetes and high blood pressure. Heart rate and blood pressure. Body temperature. Skin for abnormal spots. What immunizations do I need?  Vaccines are usually given at various ages, according to a schedule. Your health care provider will recommend vaccines for you based on your age, medical history, and lifestyle or other factors, such as travel or where you work. What tests do I need? Screening Your health care provider may recommend screening  tests for certain conditions. This may include: Lipid and cholesterol levels. Diabetes screening. This is done by checking your blood sugar (glucose) after you have not eaten for a while (fasting). Pelvic exam and Pap test. Hepatitis B test. Hepatitis C test. HIV (human immunodeficiency virus) test. STI (sexually transmitted infection) testing, if you are at risk. Lung cancer screening. Colorectal cancer screening. Mammogram. Talk with your health care provider about when you should start having regular mammograms. This may depend on whether you have a family history of breast cancer. BRCA-related cancer screening. This may be done if you have a family history of breast, ovarian, tubal, or peritoneal cancers. Bone density scan. This is done to screen for osteoporosis. Talk with your health care provider about your test results, treatment options, and if necessary, the need for more tests. Follow these instructions at home: Eating and drinking  Eat a diet that includes fresh fruits and vegetables, whole grains, lean protein, and low-fat dairy products. Take vitamin and mineral supplements as recommended by your health care provider. Do not drink alcohol if: Your health care provider tells you not to drink. You are pregnant, may be pregnant, or are planning to become pregnant. If you drink alcohol: Limit how much you have to 0-1 drink a day. Know how much alcohol is in your drink. In the [place] one drink equals one 12 oz bottle of beer (355 mL), one 5 oz glass of wine (148 mL), or one 1 oz glass of hard liquor (44 mL). Lifestyle Brush your teeth every morning and night with fluoride toothpaste. Floss one time each day. Exercise for at least 30 minutes 5 or more days each week. Do not use any products that contain  nicotine or tobacco. These products include cigarettes, chewing tobacco, and vaping devices, such as e-cigarettes. If you need help quitting, ask your health care provider. Do not  use drugs. If you are sexually active, practice safe sex. Use a condom or other form of protection to prevent STIs. If you do not wish to become pregnant, use a form of birth control. If you plan to become pregnant, see your health care provider for a prepregnancy visit. Take aspirin only as told by your health care provider. Make sure that you understand how much to take and what form to take. Work with your health care provider to find out whether it is safe and beneficial for you to take aspirin daily. Find healthy ways to manage stress, such as: Meditation, yoga, or listening to music. Journaling. Talking to a trusted person. Spending time with friends and family. Minimize exposure to UV radiation to reduce your risk of skin cancer. Safety Always wear your seat belt while driving or riding in a vehicle. Do not drive: If you have been drinking alcohol. Do not ride with someone who has been drinking. When you are tired or distracted. While texting. If you have been using any mind-altering substances or drugs. Wear a helmet and other protective equipment during sports activities. If you have firearms in your house, make sure you follow all gun safety procedures. Seek help if you have been physically or sexually abused. What's next? Visit your health care provider once a year for an annual wellness visit. Ask your health care provider how often you should have your eyes and teeth checked. Stay up to date on all vaccines. This information is not intended to replace advice given to you by your health care provider. Make sure you discuss any questions you have with your health care provider. Document Revised: 10/01/2020 Document Reviewed: 10/01/2020 Elsevier Patient Education  2024 [employer].

## 2023-04-21 ENCOUNTER — Other Ambulatory Visit: Payer: Self-pay | Admitting: Family Medicine

## 2023-04-21 DIAGNOSIS — F32A Depression, unspecified: Secondary | ICD-10-CM

## 2023-04-21 DIAGNOSIS — G4700 Insomnia, unspecified: Secondary | ICD-10-CM

## 2023-05-20 ENCOUNTER — Ambulatory Visit: Payer: 59 | Admitting: Radiology

## 2023-05-29 ENCOUNTER — Other Ambulatory Visit: Payer: Self-pay | Admitting: Radiology

## 2023-05-29 DIAGNOSIS — N951 Menopausal and female climacteric states: Secondary | ICD-10-CM

## 2023-05-30 NOTE — Telephone Encounter (Signed)
 Medication refill request: estradiol   Last AEX:  02/16/23 Next f/u: 06/14/23 Last MMG (if hormonal medication request): 03/23/23 Bi-rads 1 neg  Refill authorized: please advise

## 2023-06-14 ENCOUNTER — Ambulatory Visit: Payer: 59 | Admitting: Radiology

## 2023-06-28 ENCOUNTER — Ambulatory Visit: Payer: 59 | Admitting: Radiology

## 2023-06-28 ENCOUNTER — Encounter: Payer: Self-pay | Admitting: Radiology

## 2023-06-28 DIAGNOSIS — N951 Menopausal and female climacteric states: Secondary | ICD-10-CM | POA: Diagnosis not present

## 2023-06-28 MED ORDER — PROGESTERONE MICRONIZED 100 MG PO CAPS: 100.0000 mg | ORAL_CAPSULE | Freq: Every evening | ORAL | 3 refills | Status: AC

## 2023-06-28 MED ORDER — ESTRADIOL 0.025 MG/24HR TD PTTW: 1.0000 | MEDICATED_PATCH | TRANSDERMAL | 3 refills | Status: DC

## 2023-06-28 NOTE — Progress Notes (Signed)
   Laurie Bond 01-15-1971 [id number]   History: Postmenopausal 53 y.o. presents for follow up after starting HRT 2 months ago. Doing well, no side effects.   Gynecologic History Postmenopausal Last Pap: 11/20/20. Results were: normal Last mammogram: 03/23/23. Results were: normal   Obstetric History OB History  Gravida Para Term Preterm AB Living  2 2 0 2  2  SAB IAB Ectopic Multiple Live Births          # Outcome Date GA Lbr Len/2nd Weight Sex Type Anes PTL Lv  2 Preterm           1 Preterm                04/18/2023   11:02 AM 04/14/2022    8:03 AM 04/09/2021    8:06 AM  Depression screen PHQ 2/9  Decreased Interest 0 0 0  Down, Depressed, Hopeless 0 0 0  PHQ - 2 Score 0 0 0  Altered sleeping 0 0 0  Tired, decreased energy 0 0 0  Change in appetite 0 0 0  Feeling bad or failure about yourself  0 1 0  Trouble concentrating 0 0 0  Moving slowly or fidgety/restless 0 0 0  Suicidal thoughts 0 0 0  PHQ-9 Score 0 1 0  Difficult doing work/chores   Not difficult at all     The following portions of the patient's history were reviewed and updated as appropriate: allergies, current medications, past family history, past medical history, past social history, past surgical history, and problem list.  Review of Systems Pertinent items noted in HPI and remainder of comprehensive ROS otherwise negative.  Past medical history, past surgical history, family history and social history were all reviewed and documented in the EPIC chart.  Exam:  Vitals:   06/28/23 1359  BP: 118/72  Pulse: 76  SpO2: 98%  Weight: 178 lb 12.8 oz (81.1 kg)   Body mass index is 28 kg/m.  Physical Exam Vitals and nursing note reviewed.  Constitutional:      Appearance: Normal appearance. She is normal weight.  Pulmonary:     Effort: Pulmonary effort is normal.  Neurological:     Mental Status: She is alert.  Psychiatric:        Mood and Affect: Mood normal.        Thought Content:  Thought content normal.        Judgment: Judgment normal.      Assessment/Plan:   1. Menopausal symptoms  - estradiol (VIVELLE-DOT) 0.025 MG/24HR; Place 1 patch onto the skin 2 (two) times a week.  Dispense: 8 patch; Refill: 3 - progesterone (PROMETRIUM) 100 MG capsule; Take 1 capsule (100 mg total) by mouth at bedtime.  Dispense: 90 capsule; Refill: 3   Laurie Bond B WHNP-BC, 2:09 PM 06/28/2023

## 2023-10-27 ENCOUNTER — Other Ambulatory Visit: Payer: Self-pay | Admitting: Radiology

## 2023-10-27 DIAGNOSIS — N951 Menopausal and female climacteric states: Secondary | ICD-10-CM

## 2023-10-27 NOTE — Telephone Encounter (Signed)
.  Med refill request: Estradiol  0.025MG /24HR Last OV:06/28/23 Last AEX:02/16/23 Next AEX: No Appt scheduled  Last MMG (if hormonal med)03/23/23 BI- Rads 1 Negative  Refill authorized: Please Advise?

## 2024-02-01 ENCOUNTER — Ambulatory Visit: Admitting: Family Medicine

## 2024-02-01 ENCOUNTER — Encounter: Payer: Self-pay | Admitting: Family Medicine

## 2024-02-01 VITALS — BP 132/76 | HR 78 | Temp 98.4°F | Resp 19 | Ht 67.0 in | Wt 193.8 lb

## 2024-02-01 DIAGNOSIS — R946 Abnormal results of thyroid function studies: Secondary | ICD-10-CM

## 2024-02-01 DIAGNOSIS — R221 Localized swelling, mass and lump, neck: Secondary | ICD-10-CM

## 2024-02-01 NOTE — Progress Notes (Signed)
 Subjective:  Patient ID: Laurie Bond, female    DOB: 1970/12/09  Age: 53 y.o. MRN: [id number]  CC:  Chief Complaint  Patient presents with   Mass    Small bump/nodule on right side of neck. Small in size. May have grown in the past 6 months. No pain.     HPI Laurie Bond presents for   Right-sided neck nodule/lump Noticed past 6 months. Notices when in shower or applying lotion. Small area. No pain, slight increase in size gradually.  No f/c/night sweats/weight loss.  No recent sore throat/infection.   History Patient Active Problem List   Diagnosis Date Noted   Hyperlipidemia 03/07/2012   Depression 03/07/2012   Immune to varicella 03/07/2006   Past Medical History:  Diagnosis Date   Cholecystitis with cholelithiasis 03/15/2015   Depression    Dyspepsia and disorder of function of stomach 01/16/2014   Mild reflux symptoms, effectively reduced with Prilosec.    Gestational diabetes 2010   2nd of 2 pregnancies   Hyperlipidemia    Obesity    Pre-eclampsia 2004   Pre-eclampsia 2004   1st of 2 pregnancies   Past Surgical History:  Procedure Laterality Date   CATARACT EXTRACTION     right 6/22, left 2/23   LAPAROSCOPIC CHOLECYSTECTOMY     TUBAL LIGATION     WISDOM TOOTH EXTRACTION     No Known Allergies Prior to Admission medications   Medication Sig Start Date End Date Taking? Authorizing Provider  estradiol  (VIVELLE -DOT) 0.025 MG/24HR APPLY 1 PATCH TOPICALLY TO THE SKIN 2 TIMES A WEEK 10/28/23  Yes Chrzanowski, Jami B, NP  FLUoxetine  (PROZAC ) 40 MG capsule TAKE 1 CAPSULE(40 MG) BY MOUTH DAILY 04/18/23  Yes Levora Reyes SAUNDERS, MD  progesterone  (PROMETRIUM ) 100 MG capsule Take 1 capsule (100 mg total) by mouth at bedtime. 06/28/23  Yes Chrzanowski, Jami B, NP  traZODone  (DESYREL ) 50 MG tablet TAKE 1 TABLET(50 MG) BY MOUTH AT BEDTIME 04/18/23  Yes Levora Reyes SAUNDERS, MD   Social History   Socioeconomic History   Marital status: Married    Spouse name:  Alm   Number of children: 2   Years of education: 14   Highest education level: Not on file  Occupational History   Occupation: Teacher, adult education: [employer]  Tobacco Use   Smoking status: Never    Passive exposure: Never   Smokeless tobacco: Never  Vaping Use   Vaping status: Never Used  Substance and Sexual Activity   Alcohol use: Yes    Comment: once a week    Drug use: No   Sexual activity: Yes    Partners: Male    Birth control/protection: Pill    Comment: POP, 1st intercourse- 16, partners- 6  Other Topics Concern   Not on file  Social History Narrative   Lives with husband and 2 children.   Social Drivers of Corporate investment banker Strain: Not on file  Food Insecurity: Not on file  Transportation Needs: Not on file  Physical Activity: Not on file  Stress: Not on file  Social Connections: Not on file  Intimate Partner Violence: Not on file    Review of Systems Per HPI.   Objective:   Vitals:   02/01/24 1451  BP: 132/76  Pulse: 78  Resp: 19  Temp: 98.4 F (36.9 C)  TempSrc: Temporal  SpO2: 97%  Weight: 193 lb 12.8 oz (87.9 kg)  Height: 5' 7 (1.702 m)  Physical Exam Constitutional:      General: She is not in acute distress.    Appearance: Normal appearance. She is well-developed.  HENT:     Head: Normocephalic and atraumatic.  Neck:     Comments: Possible small nontender nodular area at the right lobe, upper aspect, less than 1 cm.  No apparent lymphadenopathy on neck or other nodularity appreciated.  Trachea midline, no stridor.  Skin intact without erythema, rash or any apparent cystic structures. Cardiovascular:     Rate and Rhythm: Normal rate.  Pulmonary:     Effort: Pulmonary effort is normal.  Skin:    General: Skin is warm and dry.     Findings: No rash.  Neurological:     Mental Status: She is alert and oriented to person, place, and time.  Psychiatric:        Mood and Affect: Mood normal.         Assessment & Plan:  Laurie Bond is a 53 y.o. female . Abnormal thyroid  exam - Plan: TSH, US  THYROID   Neck nodule - Plan: TSH, US  THYROID   Possible very small nodular area at the right lobe of her thyroid , upper aspect.  No apparent lymphadenopathy.  Check TSH, ultrasound to further clarify and evaluate area of concern of slight increase in size.  Further treatment, eval depending on results above.  73-month follow-up for physical.  No orders of the defined types were placed in this encounter.  Patient Instructions  Exam is overall reassuring but I think it would be reasonable to check a thyroid  test and ultrasound to see if there may be a small nodule in the thyroid .  If any concerns on those studies I will let you know.  If anything changes let me know.  Follow-up in December for physical as planned unless we see something of concern on the ultrasound.  Again I am overall reassured today.  Thanks for coming in and let me know if there are questions.    Signed,   Reyes Pines, MD [facility], [facility] 02/01/24 3:41 PM

## 2024-02-01 NOTE — Patient Instructions (Signed)
 Exam is overall reassuring but I think it would be reasonable to check a thyroid  test and ultrasound to see if there may be a small nodule in the thyroid .  If any concerns on those studies I will let you know.  If anything changes let me know.  Follow-up in December for physical as planned unless we see something of concern on the ultrasound.  Again I am overall reassured today.  Thanks for coming in and let me know if there are questions.

## 2024-02-02 LAB — TSH: TSH: 0.82 u[IU]/mL (ref 0.35–5.50)

## 2024-02-03 ENCOUNTER — Ambulatory Visit
Admission: RE | Admit: 2024-02-03 | Discharge: 2024-02-03 | Disposition: A | Discharge status: Home / Self Care | Source: Ambulatory Visit | Attending: Family Medicine | Admitting: Family Medicine

## 2024-02-03 ENCOUNTER — Ambulatory Visit: Payer: Self-pay | Admitting: Family Medicine

## 2024-02-03 DIAGNOSIS — E041 Nontoxic single thyroid nodule: Secondary | ICD-10-CM

## 2024-02-03 DIAGNOSIS — R221 Localized swelling, mass and lump, neck: Secondary | ICD-10-CM

## 2024-02-03 DIAGNOSIS — R946 Abnormal results of thyroid function studies: Secondary | ICD-10-CM

## 2024-02-06 NOTE — Telephone Encounter (Signed)
Patient responded.

## 2024-02-22 ENCOUNTER — Other Ambulatory Visit: Payer: Self-pay | Admitting: Radiology

## 2024-02-22 DIAGNOSIS — N951 Menopausal and female climacteric states: Secondary | ICD-10-CM

## 2024-02-22 NOTE — Telephone Encounter (Signed)
 Medication refill request: estradiol  patch 0.025 Last AEX:  06/28/23 Next AEX: 05/09/24 Last MMG (if hormonal medication request): 03/24/20 normal  Refill authorized: last refill 10/28/23 #8 with 3 rf. Pended today #24 0 rf

## 2024-02-27 ENCOUNTER — Other Ambulatory Visit: Payer: Self-pay | Admitting: Family Medicine

## 2024-02-27 DIAGNOSIS — Z1231 Encounter for screening mammogram for malignant neoplasm of breast: Secondary | ICD-10-CM

## 2024-03-09 ENCOUNTER — Other Ambulatory Visit (HOSPITAL_COMMUNITY)
Admission: RE | Admit: 2024-03-09 | Discharge: 2024-03-09 | Disposition: A | Discharge status: Home / Self Care | Source: Ambulatory Visit | Attending: Family Medicine | Admitting: Family Medicine

## 2024-03-09 ENCOUNTER — Ambulatory Visit
Admission: RE | Admit: 2024-03-09 | Discharge: 2024-03-09 | Disposition: A | Discharge status: Home / Self Care | Source: Ambulatory Visit | Attending: Family Medicine | Admitting: Family Medicine

## 2024-03-09 DIAGNOSIS — E041 Nontoxic single thyroid nodule: Secondary | ICD-10-CM | POA: Diagnosis present

## 2024-03-11 ENCOUNTER — Ambulatory Visit: Payer: Self-pay | Admitting: Family Medicine

## 2024-03-13 ENCOUNTER — Ambulatory Visit: Payer: Self-pay | Admitting: Family Medicine

## 2024-03-13 LAB — CYTOLOGY - NON PAP

## 2024-03-27 ENCOUNTER — Ambulatory Visit: Payer: Self-pay | Admitting: Family Medicine

## 2024-03-27 ENCOUNTER — Encounter (HOSPITAL_COMMUNITY): Payer: Self-pay

## 2024-03-27 NOTE — Telephone Encounter (Addendum)
 Reply sent

## 2024-03-28 ENCOUNTER — Ambulatory Visit
Admission: RE | Admit: 2024-03-28 | Discharge: 2024-03-28 | Disposition: A | Discharge status: Home / Self Care | Source: Ambulatory Visit

## 2024-03-28 ENCOUNTER — Ambulatory Visit: Admitting: Family Medicine

## 2024-03-28 DIAGNOSIS — Z1231 Encounter for screening mammogram for malignant neoplasm of breast: Secondary | ICD-10-CM

## 2024-04-04 ENCOUNTER — Ambulatory Visit: Admitting: Family Medicine

## 2024-04-21 ENCOUNTER — Other Ambulatory Visit: Payer: Self-pay | Admitting: Family Medicine

## 2024-04-21 DIAGNOSIS — F32A Depression, unspecified: Secondary | ICD-10-CM

## 2024-04-21 DIAGNOSIS — G4700 Insomnia, unspecified: Secondary | ICD-10-CM

## 2024-04-25 ENCOUNTER — Ambulatory Visit: Admitting: Family Medicine

## 2024-04-25 ENCOUNTER — Encounter: Payer: Self-pay | Admitting: Family Medicine

## 2024-04-25 VITALS — BP 122/74 | HR 77 | Temp 98.6°F | Resp 16 | Ht 67.0 in | Wt 197.0 lb

## 2024-04-25 DIAGNOSIS — E041 Nontoxic single thyroid nodule: Secondary | ICD-10-CM

## 2024-04-25 DIAGNOSIS — Z23 Encounter for immunization: Secondary | ICD-10-CM | POA: Diagnosis not present

## 2024-04-25 NOTE — Patient Instructions (Signed)
 Thanks for coming in today.  I will refer you to a general surgeon who specializes in thyroid  treatment to decide on any changes on repeat ultrasound interval or other testing.  Based on your recent fine-needle aspiration results, I think it would be reasonable to repeat ultrasound in 9 months when we are checking the other nodules as well.  However please discuss further with general surgery to see if they recommend a change in this plan.  If you have not received a call from the surgery office in the next few weeks, please let me know.  Pneumonia vaccine updated today.  Can discuss other health maintenance, and your medications, labs at physical in March.  Take care!

## 2024-04-25 NOTE — Progress Notes (Signed)
 "  Subjective:  Patient ID: Laurie Bond, female    DOB: 1970-05-22  Age: 54 y.o. MRN: [id number]  CC:  Chief Complaint  Patient presents with   Follow-up    Patient made CPE appt in March so she does not need a CPE today. She is following up for a fine needle biopsy. Results were good. Wants to know next steps. Patient is aware that we do not have lab today and is okay with either option. Patient received flu shot from [facility]. Colonoscopy is scheduled for March 2026. She is due for a PAP in Feb 2026    HPI Laurie Bond presents for   Thyroid  nodule Initially evaluated in October, noted lump/nodule previous 6 months on the right side of her neck.  Multinodular thyroid  gland noted on October 17 ultrasound.  One of the nodules (#2 nodule) was 2.4 cm, meeting criteria for imaging surveillance with ultrasound at 1 year.  #6 nodule in the left mid thyroid  did meet criteria to consider fine-needle aspiration.  That nodule measured 2.3 x 1.7 x 2.0 cm.  This was performed on 03/09/2024.  Pathology results were follicular lesion of undetermined significance.  Bethesda category 3.  Based on these results, specimen was sent for Afirma testing.Result indicated Afirma GSC benign with suggest a low risk of cancer at approximately 4%.  Treatment recommended like a cytologically benign nodule including clinical correlation.   Has CPE with me in a few months.     History Patient Active Problem List   Diagnosis Date Noted   Hyperlipidemia 03/07/2012   Depression 03/07/2012   Immune to varicella 03/07/2006   Past Medical History:  Diagnosis Date   Cholecystitis with cholelithiasis 03/15/2015   Depression    Dyspepsia and disorder of function of stomach 01/16/2014   Mild reflux symptoms, effectively reduced with Prilosec.    Gestational diabetes 2010   2nd of 2 pregnancies   Hyperlipidemia    Obesity    Pre-eclampsia 2004   Pre-eclampsia 2004   1st of 2 pregnancies   Past Surgical  History:  Procedure Laterality Date   CATARACT EXTRACTION     right 6/22, left 2/23   LAPAROSCOPIC CHOLECYSTECTOMY     TUBAL LIGATION     WISDOM TOOTH EXTRACTION     Allergies[1] Prior to Admission medications  Medication Sig Start Date End Date Taking? Authorizing Provider  FLUoxetine  (PROZAC ) 40 MG capsule TAKE 1 CAPSULE(40 MG) BY MOUTH DAILY 04/23/24  Yes Levora Laurie SAUNDERS, MD  progesterone  (PROMETRIUM ) 100 MG capsule Take 1 capsule (100 mg total) by mouth at bedtime. 06/28/23  Yes Chrzanowski, Jami B, NP  traZODone  (DESYREL ) 50 MG tablet TAKE 1 TABLET(50 MG) BY MOUTH AT BEDTIME 04/23/24  Yes Levora Laurie SAUNDERS, MD  estradiol  (VIVELLE -DOT) 0.025 MG/24HR APPLY 1 PATCH TOPICALLY TO THE SKIN 2 TIMES A WEEK Patient not taking: Reported on 04/25/2024 02/22/24   Ginette Shasta NOVAK, NP   Social History   Socioeconomic History   Marital status: Married    Spouse name: Alm   Number of children: 2   Years of education: 14   Highest education level: Not on file  Occupational History   Occupation: Teacher, Adult Education: [employer]  Tobacco Use   Smoking status: Never    Passive exposure: Never   Smokeless tobacco: Never  Vaping Use   Vaping status: Never Used  Substance and Sexual Activity   Alcohol use: Yes    Comment: once a  week    Drug use: No   Sexual activity: Yes    Partners: Male    Birth control/protection: Pill    Comment: POP, 1st intercourse- 16, partners- 6  Other Topics Concern   Not on file  Social History Narrative   Lives with husband and 2 children.   Social Drivers of Health   Tobacco Use: Low Risk (04/25/2024)   Patient History    Smoking Tobacco Use: Never    Smokeless Tobacco Use: Never    Passive Exposure: Never  Financial Resource Strain: Not on file  Food Insecurity: Not on file  Transportation Needs: Not on file  Physical Activity: Not on file  Stress: Not on file  Social Connections: Not on file  Intimate Partner Violence: Not on file   Depression (PHQ2-9): Low Risk (04/25/2024)   Depression (PHQ2-9)    PHQ-2 Score: 1  Alcohol Screen: Not on file  Housing: Not on file  Utilities: Not on file  Health Literacy: Not on file    Review of Systems Per hpi  Objective:   Vitals:   04/25/24 1554  BP: 122/74  Pulse: 77  Resp: 16  Temp: 98.6 F (37 C)  TempSrc: Temporal  SpO2: 97%  Weight: 197 lb (89.4 kg)  Height: 5' 7 (1.702 m)     Physical Exam Vitals reviewed.  Constitutional:      General: She is not in acute distress.    Appearance: Normal appearance. She is well-developed.  HENT:     Head: Normocephalic and atraumatic.  Cardiovascular:     Rate and Rhythm: Normal rate.  Pulmonary:     Effort: Pulmonary effort is normal.  Neurological:     Mental Status: She is alert and oriented to person, place, and time.  Psychiatric:        Mood and Affect: Mood normal.        Assessment & Plan:  Laurie Bond is a 54 y.o. female . Thyroid  nodule - Plan: Ambulatory referral to General Surgery  - We discussed results of fine-needle aspiration and Afirma molecular testing.  Reassuring.  Suspect repeat ultrasound in October to follow-up multiple nodules would be reasonable but we will have her meet with  general surgery to verify this plan and to make additional recommendations if needed.  Need for pneumococcal vaccination - Plan: Pneumococcal conjugate vaccine 20-valent (Prevnar 20)   No orders of the defined types were placed in this encounter.  Patient Instructions  Thanks for coming in today.  I will refer you to a general surgeon who specializes in thyroid  treatment to decide on any changes on repeat ultrasound interval or other testing.  Based on your recent fine-needle aspiration results, I think it would be reasonable to repeat ultrasound in 9 months when we are checking the other nodules as well.  However please discuss further with general surgery to see if they recommend a change in this  plan.  If you have not received a call from the surgery office in the next few weeks, please let me know.  Pneumonia vaccine updated today.  Can discuss other health maintenance, and your medications, labs at physical in March.  Take care!    Signed,   Laurie Pines, MD [facility], [facility] 04/25/2024 9:51 PM       [1] No Known Allergies  "

## 2024-05-03 ENCOUNTER — Encounter: Admitting: Family Medicine

## 2024-05-03 ENCOUNTER — Ambulatory Visit: Admitting: Family Medicine

## 2024-05-09 ENCOUNTER — Ambulatory Visit

## 2024-05-09 ENCOUNTER — Ambulatory Visit: Admitting: Radiology

## 2024-05-09 VITALS — Ht 67.0 in | Wt 195.0 lb

## 2024-05-09 DIAGNOSIS — Z860100 Personal history of colon polyps, unspecified: Secondary | ICD-10-CM

## 2024-05-09 MED ORDER — NA SULFATE-K SULFATE-MG SULF 17.5-3.13-1.6 GM/177ML PO SOLN: 1.0000 | Freq: Once | ORAL | 0 refills | Status: AC

## 2024-05-09 NOTE — Progress Notes (Signed)
 PCP MD at time of PV: Levora, MD  __________________________________________________________________________________________________________________________________________  No egg allergy known to patient  No soy allergy known to patient No issues known to pt with past sedation with any surgeries or procedures Patient denies ever being told they had issues or difficulty with intubation  No FH of Malignant Hyperthermia Pt is not on diet pills Pt is not on  home 02  Pt is not on blood thinners  No A fib or A flutter Have any cardiac testing pending--no  LOA: independent  No Chew or Snuff tobacco __________________________________________________________________________________________________________________________________________  Constipation: no  Prep: suprep  __________________________________________________________________________________________________________________________________________  PV completed with patient. Prep instructions reviewed and provided during apt. Rx sent to preferred pharmacy.  __________________________________________________________________________________________________________________________________________  Patient's chart reviewed by Norleen Schillings CNRA prior to previsit and patient appropriate for the [facility].  Previsit completed and red dot placed by patient's name on their procedure day (on provider's schedule).

## 2024-05-18 ENCOUNTER — Encounter: Payer: Self-pay | Admitting: Gastroenterology

## 2024-05-24 ENCOUNTER — Ambulatory Visit: Admitting: Radiology

## 2024-06-22 ENCOUNTER — Encounter: Admitting: Gastroenterology

## 2024-06-27 ENCOUNTER — Ambulatory Visit: Admitting: Radiology

## 2024-07-11 ENCOUNTER — Encounter: Admitting: Family Medicine
